# Patient Record
Sex: Female | Born: 2000 | Race: White | Hispanic: Yes | Smoking: Never smoker
Health system: Southern US, Community
[De-identification: ages and names within clinical notes are randomized; demographics above are authoritative.]

## PROBLEM LIST (undated history)

## (undated) DIAGNOSIS — J45909 Unspecified asthma, uncomplicated: Secondary | ICD-10-CM

## (undated) DIAGNOSIS — K219 Gastro-esophageal reflux disease without esophagitis: Secondary | ICD-10-CM

## (undated) HISTORY — PX: NO PAST SURGERIES: SHX2092

---
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
## 2022-01-17 DIAGNOSIS — Z0001 Encounter for general adult medical examination with abnormal findings: Secondary | ICD-10-CM | POA: Diagnosis not present

## 2022-01-17 DIAGNOSIS — N912 Amenorrhea, unspecified: Secondary | ICD-10-CM | POA: Diagnosis not present

## 2022-01-17 DIAGNOSIS — Z113 Encounter for screening for infections with a predominantly sexual mode of transmission: Secondary | ICD-10-CM | POA: Diagnosis not present

## 2022-01-17 DIAGNOSIS — Z30011 Encounter for initial prescription of contraceptive pills: Secondary | ICD-10-CM | POA: Diagnosis not present

## 2022-01-17 DIAGNOSIS — Z6829 Body mass index (BMI) 29.0-29.9, adult: Secondary | ICD-10-CM | POA: Diagnosis not present

## 2022-01-17 DIAGNOSIS — Z3040 Encounter for surveillance of contraceptives, unspecified: Secondary | ICD-10-CM | POA: Diagnosis not present

## 2022-01-17 DIAGNOSIS — Z01411 Encounter for gynecological examination (general) (routine) with abnormal findings: Secondary | ICD-10-CM | POA: Diagnosis not present

## 2022-01-31 IMAGING — CT CT ABD-PELV W/ CM
2 of 4 series · 16 of 46 positions shown, 18 images · IV contrast (omnipaque)
Comparison: None

CLINICAL DATA: Right lower quadrant abdominal pain and nausea.

EXAM:
CT ABDOMEN AND PELVIS WITH CONTRAST
TECHNIQUE: Multidetector CT imaging of the abdomen and pelvis was performed
using the standard protocol following bolus administration of
intravenous contrast.
CONTRAST:  100mL OMNIPAQUE IOHEXOL 300 MG/ML  SOLN

[Series 2: axial st · axial · 0.75mm/px · z∈[+958,+1354]mm · 13 of 89 slices shown, 15 images]
[im 5/89  soft-tissue]
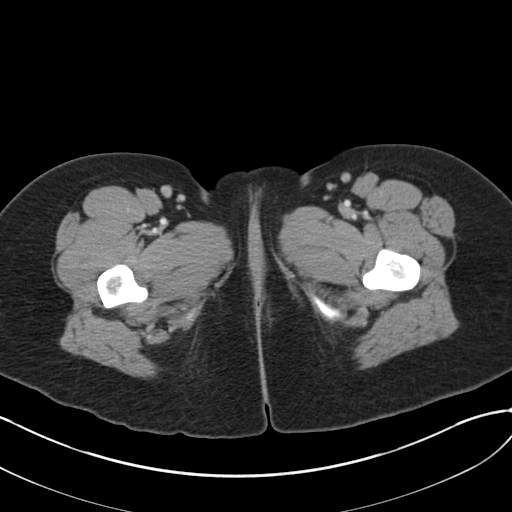
[im 5/89  bone]
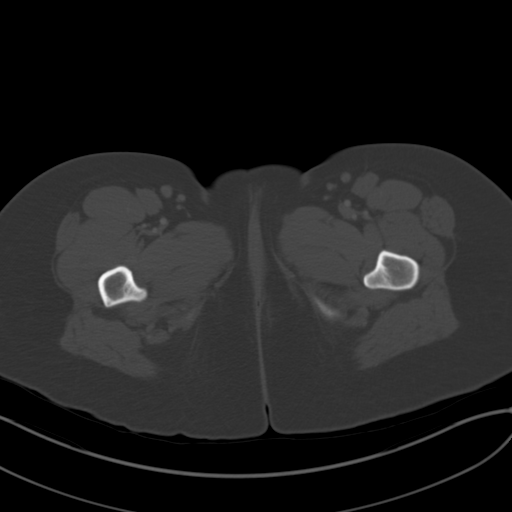
[im 14/89  soft-tissue]
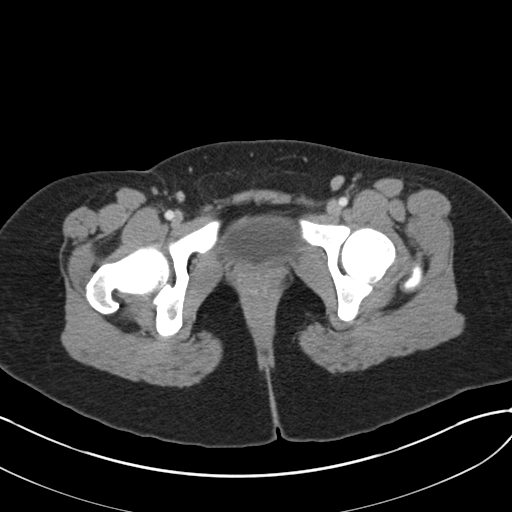
[im 19/89  soft-tissue]
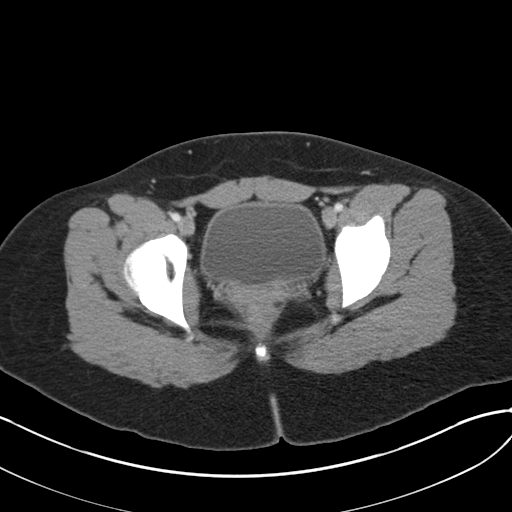
[im 24/89  soft-tissue]
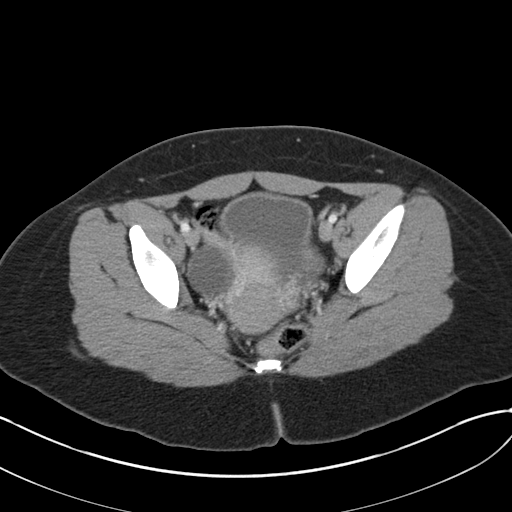
[im 33/89  soft-tissue]
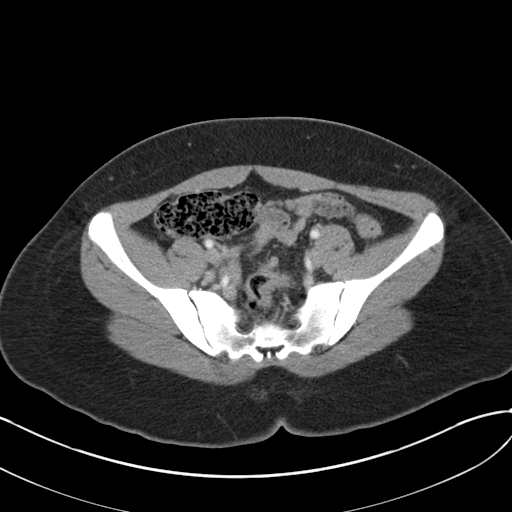
[im 38/89  soft-tissue]
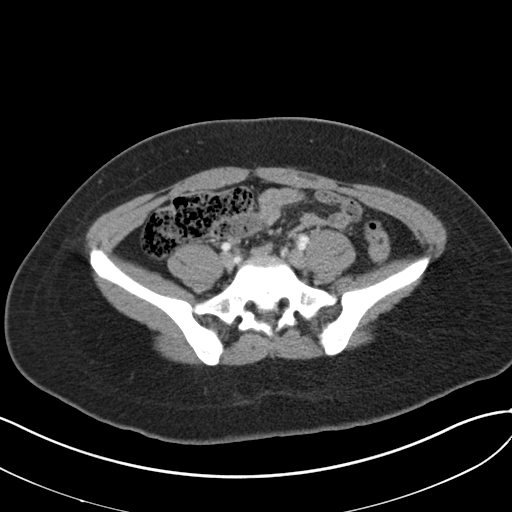
[im 47/89  soft-tissue]
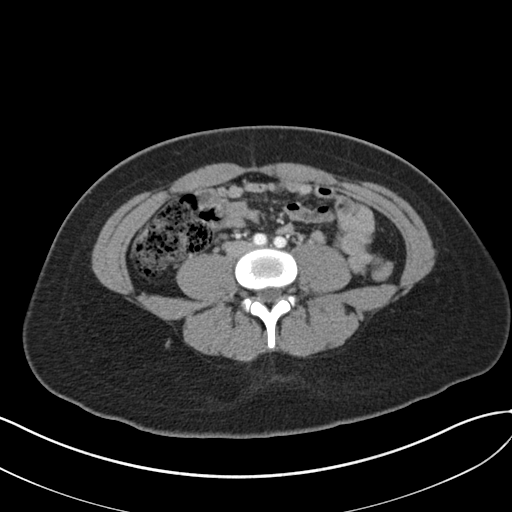
[im 51/89  soft-tissue]
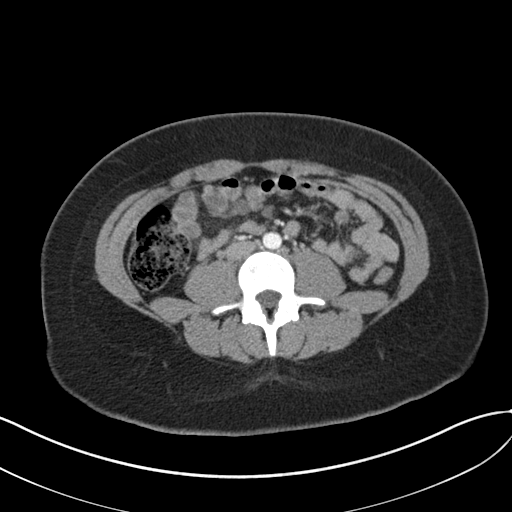
[im 56/89  soft-tissue]
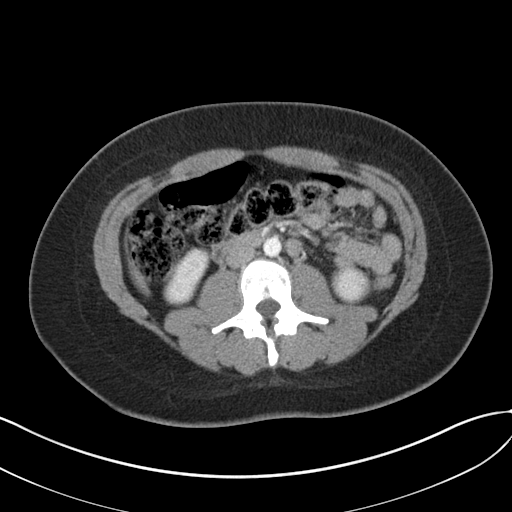
[im 56/89  bone]
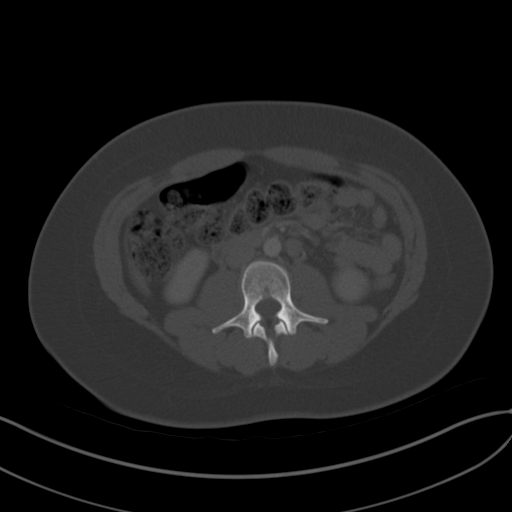
[im 65/89  soft-tissue]
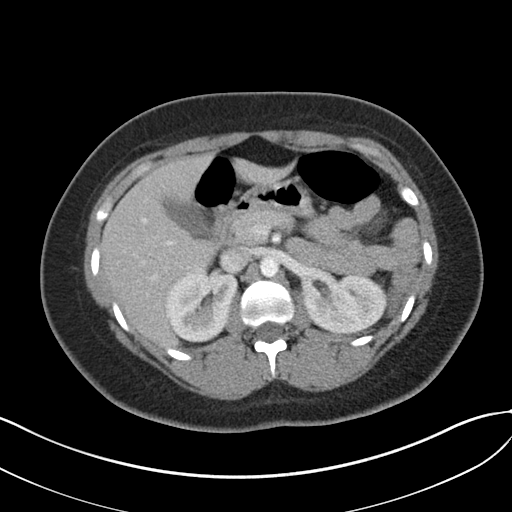
[im 70/89  soft-tissue]
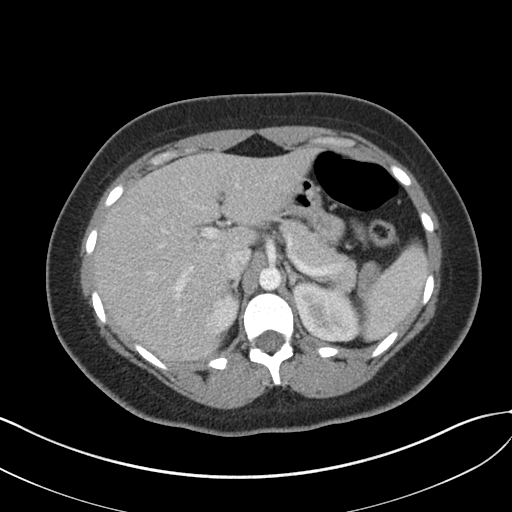
[im 75/89  soft-tissue]
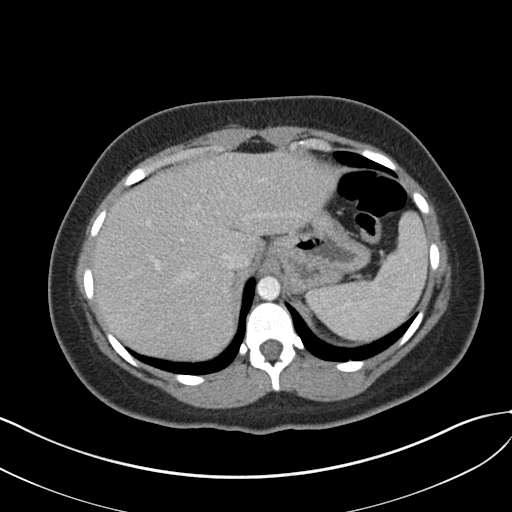
[im 84/89  soft-tissue]
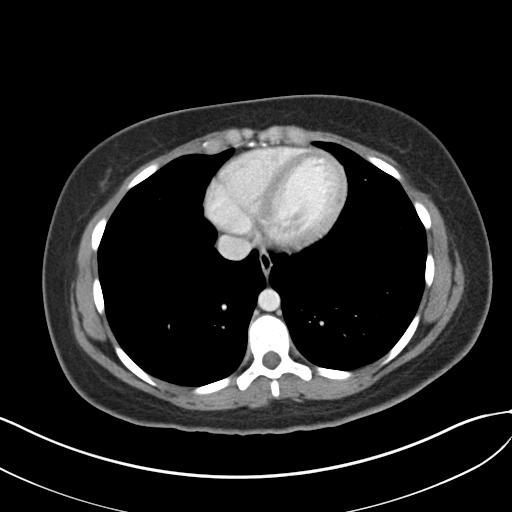

[Series 4: coronal st · coronal · 0.68mm/px · 3 of 137 slices shown]
[im 46/137  soft-tissue]
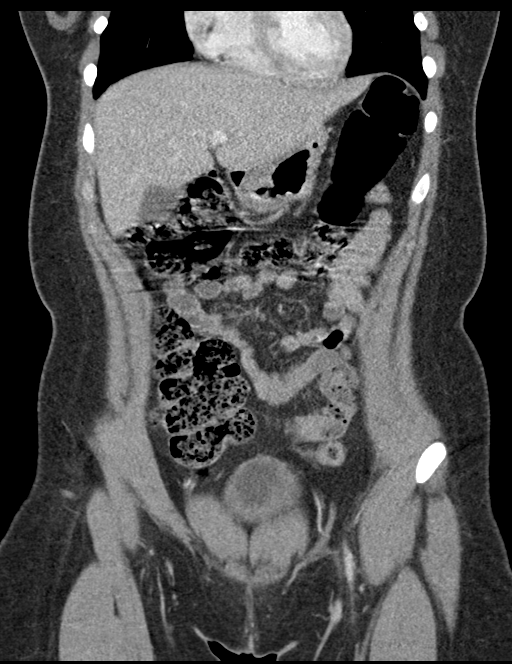
[im 61/137  soft-tissue]
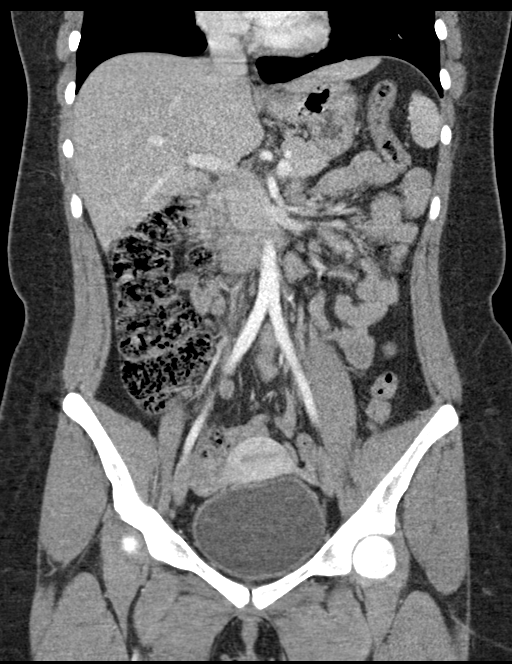
[im 76/137  soft-tissue]
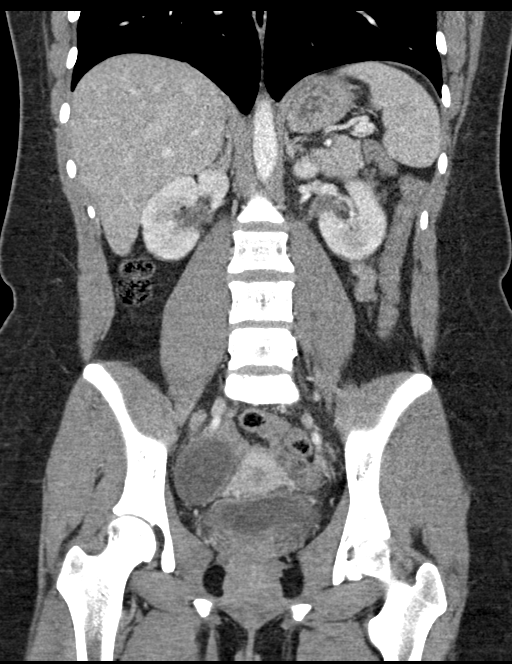

[16 of 46 positions shown; findings below may reference images not displayed]

FINDINGS: Lower chest: Insert lung bases

Hepatobiliary: No focal hepatic lesions or intrahepatic biliary
dilatation. The gallbladder is normal. No common bile duct
dilatation.

Pancreas: No mass, inflammation or ductal dilatation.

Spleen: Normal size.  No focal lesions.

Adrenals/Urinary Tract: The adrenal glands and kidneys are
unremarkable. No renal calculi or obstructing ureteral calculi. No
findings for pyelonephritis. The bladder is unremarkable.

Stomach/Bowel: The stomach, duodenum, small bowel and colon are
unremarkable. No acute inflammatory changes, mass lesions or
obstructive findings. The terminal ileum is normal. The appendix is
not identified for certain but I do not see any findings to suggest
acute appendicitis.

Vascular/Lymphatic: The aorta is normal in caliber. No dissection.
The branch vessels are patent. The major venous structures are
patent. No mesenteric or retroperitoneal mass or adenopathy. Small
scattered lymph nodes are noted.

Reproductive: The uterus is unremarkable. There is a simple
appearing 19 mm cyst associated with the left ovary and a simple 4
cm cyst associated with the right ovary.

Other: No pelvic mass or adenopathy. No free pelvic fluid
collections. No inguinal mass or adenopathy. No abdominal wall
hernia or subcutaneous lesions.

Musculoskeletal: No significant bony findings.
IMPRESSION: 1. No acute abdominal/pelvic findings, mass lesions or adenopathy.
2. Bilateral ovarian cysts.

## 2022-03-01 ENCOUNTER — Inpatient Hospital Stay (HOSPITAL_BASED_OUTPATIENT_CLINIC_OR_DEPARTMENT_OTHER): Payer: Medicaid Other

## 2022-03-01 ENCOUNTER — Encounter (HOSPITAL_COMMUNITY): Payer: Self-pay | Admitting: *Deleted

## 2022-03-01 ENCOUNTER — Inpatient Hospital Stay (HOSPITAL_COMMUNITY)
Admission: AD | Admit: 2022-03-01 | Discharge: 2022-03-01 | Disposition: A | Discharge status: Home / Self Care | Payer: Medicaid Other | Attending: Obstetrics and Gynecology | Admitting: Obstetrics and Gynecology

## 2022-03-01 DIAGNOSIS — Z3A24 24 weeks gestation of pregnancy: Secondary | ICD-10-CM | POA: Diagnosis not present

## 2022-03-01 DIAGNOSIS — O26899 Other specified pregnancy related conditions, unspecified trimester: Secondary | ICD-10-CM

## 2022-03-01 DIAGNOSIS — R102 Pelvic and perineal pain: Secondary | ICD-10-CM | POA: Diagnosis not present

## 2022-03-01 DIAGNOSIS — R1030 Lower abdominal pain, unspecified: Secondary | ICD-10-CM

## 2022-03-01 DIAGNOSIS — O26893 Other specified pregnancy related conditions, third trimester: Secondary | ICD-10-CM | POA: Diagnosis not present

## 2022-03-01 DIAGNOSIS — O26892 Other specified pregnancy related conditions, second trimester: Secondary | ICD-10-CM | POA: Diagnosis present

## 2022-03-01 DIAGNOSIS — O0932 Supervision of pregnancy with insufficient antenatal care, second trimester: Secondary | ICD-10-CM | POA: Diagnosis not present

## 2022-03-01 HISTORY — DX: Unspecified asthma, uncomplicated: J45.909

## 2022-03-01 LAB — HEMOGLOBIN A1C
Hgb A1c MFr Bld: 4.7 % — ABNORMAL LOW (ref 4.8–5.6)
Mean Plasma Glucose: 88.19 mg/dL

## 2022-03-01 LAB — DIFFERENTIAL
Abs Immature Granulocytes: 0.06 10*3/uL (ref 0.00–0.07)
Basophils Absolute: 0 10*3/uL (ref 0.0–0.1)
Basophils Relative: 0 %
Eosinophils Absolute: 0.1 10*3/uL (ref 0.0–0.5)
Eosinophils Relative: 1 %
Immature Granulocytes: 1 %
Lymphocytes Relative: 25 %
Lymphs Abs: 2.1 10*3/uL (ref 0.7–4.0)
Monocytes Absolute: 0.7 10*3/uL (ref 0.1–1.0)
Monocytes Relative: 8 %
Neutro Abs: 5.6 10*3/uL (ref 1.7–7.7)
Neutrophils Relative %: 65 %

## 2022-03-01 LAB — URINALYSIS, ROUTINE W REFLEX MICROSCOPIC
Bacteria, UA: NONE SEEN
Bilirubin Urine: NEGATIVE
Glucose, UA: NEGATIVE mg/dL
Hgb urine dipstick: NEGATIVE
Ketones, ur: NEGATIVE mg/dL
Nitrite: NEGATIVE
Protein, ur: NEGATIVE mg/dL
Specific Gravity, Urine: 1.014 (ref 1.005–1.030)
pH: 7 (ref 5.0–8.0)

## 2022-03-01 LAB — CBC
HCT: 32 % — ABNORMAL LOW (ref 36.0–46.0)
Hemoglobin: 11.2 g/dL — ABNORMAL LOW (ref 12.0–15.0)
MCH: 31.5 pg (ref 26.0–34.0)
MCHC: 35 g/dL (ref 30.0–36.0)
MCV: 90.1 fL (ref 80.0–100.0)
Platelets: 258 10*3/uL (ref 150–400)
RBC: 3.55 MIL/uL — ABNORMAL LOW (ref 3.87–5.11)
RDW: 13.2 % (ref 11.5–15.5)
WBC: 8.5 10*3/uL (ref 4.0–10.5)
nRBC: 0 % (ref 0.0–0.2)

## 2022-03-01 LAB — WET PREP, GENITAL
Clue Cells Wet Prep HPF POC: NONE SEEN
Sperm: NONE SEEN
Trich, Wet Prep: NONE SEEN
WBC, Wet Prep HPF POC: <10 (ref <10)
Yeast Wet Prep HPF POC: NONE SEEN

## 2022-03-01 LAB — TYPE AND SCREEN
ABO/RH(D): O POS
Antibody Screen: NEGATIVE

## 2022-03-01 LAB — HEPATITIS B SURFACE ANTIGEN: Hepatitis B Surface Ag: NONREACTIVE

## 2022-03-01 LAB — HIV ANTIBODY (ROUTINE TESTING W REFLEX): HIV Screen 4th Generation wRfx: NONREACTIVE

## 2022-03-01 NOTE — MAU Note (Signed)
Jo Sloan is a 21 y.o. at Unknown here in MAU reporting: having a little bit of abd pain, lower abd. Started yesterday. No bleeding. No PNC< had [place] at [facility] 5/18, was given due date.   Onset of complaint: yesterday Pain score: mild cramping- comes a couple times a day Vitals:   03/01/22 1755  BP: 116/70  Pulse: 74  Resp: 16  Temp: 98.9 F (37.2 C)  SpO2: 100%     FHT:136 Lab orders placed from triage:  urine

## 2022-03-01 NOTE — Discharge Instructions (Signed)

## 2022-03-01 NOTE — MAU Provider Note (Signed)
History     CSN: [id number]  Arrival date and time: 03/01/22 1542   Event Date/Time   First Provider Initiated Contact with Patient 03/01/22 1846      Chief Complaint  Patient presents with   Abdominal Pain   HPI  Jo SimonsJennifer Sloan is a 21 y.o. G1P0 at 2175w6d who presents for evaluation of lower abdominal pain. Patient reports sharp shooting abdominal pain that is intermittent and worse when she walks. She denies any pain at this time. She denies any vaginal bleeding, discharge, and leaking of fluid. Denies any constipation, diarrhea or any urinary complaints. Reports normal fetal movement.  She has not had any care in this pregnancy yet. She reports difficulty finding someone to take her because of how far along she is.    OB History     Gravida  1   Para      Term      Preterm      AB      Living         SAB      IAB      Ectopic      Multiple      Live Births              Past Medical History:  Diagnosis Date   Asthma    GERD (gastroesophageal reflux disease)     Past Surgical History:  Procedure Laterality Date   NO PAST SURGERIES      Family History  Problem Relation Age of Onset   Healthy Mother    Healthy Father     Social History   Tobacco Use   Smoking status: Never   Smokeless tobacco: Never  Vaping Use   Vaping Use: Never used  Substance Use Topics   Alcohol use: Never   Drug use: Not Currently    Frequency: 2.0 times per week    Types: Marijuana    Comment: stopped in March    Allergies: No Known Allergies  No medications prior to admission.    Review of Systems  Constitutional: Negative.  Negative for fatigue and fever.  HENT: Negative.    Respiratory: Negative.  Negative for shortness of breath.   Cardiovascular: Negative.  Negative for chest pain.  Gastrointestinal:  Positive for abdominal pain. Negative for constipation, diarrhea, nausea and vomiting.  Genitourinary: Negative.  Negative for dysuria.   Neurological: Negative.  Negative for dizziness and headaches.  Physical Exam   Blood pressure 116/70, pulse 74, temperature 98.9 F (37.2 C), temperature source Oral, resp. rate 16, height 5\' 1"  (1.549 m), weight 74 kg, last menstrual period 09/15/2021, SpO2 100 %.  Patient Vitals for the past 24 hrs:  BP Temp Temp src Pulse Resp SpO2 Height Weight  03/01/22 1755 116/70 98.9 F (37.2 C) Oral 74 16 100 % 5\' 1"  (1.549 m) 74 kg    Physical Exam Vitals and nursing note reviewed.  Constitutional:      General: She is not in acute distress.    Appearance: She is well-developed.  HENT:     Head: Normocephalic.  Eyes:     Pupils: Pupils are equal, round, and reactive to light.  Cardiovascular:     Rate and Rhythm: Normal rate and regular rhythm.     Heart sounds: Normal heart sounds.  Pulmonary:     Effort: Pulmonary effort is normal. No respiratory distress.     Breath sounds: Normal breath sounds.  Abdominal:     General: Bowel  sounds are normal. There is no distension.     Palpations: Abdomen is soft.     Tenderness: There is no abdominal tenderness.  Skin:    General: Skin is warm and dry.  Neurological:     Mental Status: She is alert and oriented to person, place, and time.  Psychiatric:        Mood and Affect: Mood normal.        Behavior: Behavior normal.        Thought Content: Thought content normal.        Judgment: Judgment normal.   Fetal Tracing:  Baseline: 140 Variability: moderate Accels: none Decels: none  Toco: none  Dilation: Closed Effacement (%): Thick Exam by:: Cleone Slim CNM   MAU Course  Procedures  Results for orders placed or performed during the hospital encounter of 03/01/22 (from the past 24 hour(s))  Urinalysis, Routine w reflex microscopic Urine, Clean Catch     Status: Abnormal   Collection Time: 03/01/22  6:01 PM  Result Value Ref Range   Color, Urine YELLOW YELLOW   APPearance HAZY (A) CLEAR   Specific Gravity, Urine  1.014 1.005 - 1.030   pH 7.0 5.0 - 8.0   Glucose, UA NEGATIVE NEGATIVE mg/dL   Hgb urine dipstick NEGATIVE NEGATIVE   Bilirubin Urine NEGATIVE NEGATIVE   Ketones, ur NEGATIVE NEGATIVE mg/dL   Protein, ur NEGATIVE NEGATIVE mg/dL   Nitrite NEGATIVE NEGATIVE   Leukocytes,Ua TRACE (A) NEGATIVE   RBC / HPF 0-5 0 - 5 RBC/hpf   WBC, UA 0-5 0 - 5 WBC/hpf   Bacteria, UA NONE SEEN NONE SEEN   Squamous Epithelial / LPF 6-10 0 - 5  Hepatitis B surface antigen     Status: None   Collection Time: 03/01/22  7:06 PM  Result Value Ref Range   Hepatitis B Surface Ag NON REACTIVE NON REACTIVE  CBC     Status: Abnormal   Collection Time: 03/01/22  7:06 PM  Result Value Ref Range   WBC 8.5 4.0 - 10.5 K/uL   RBC 3.55 (L) 3.87 - 5.11 MIL/uL   Hemoglobin 11.2 (L) 12.0 - 15.0 g/dL   HCT 43.1 (L) 54.0 - 08.6 %   MCV 90.1 80.0 - 100.0 fL   MCH 31.5 26.0 - 34.0 pg   MCHC 35.0 30.0 - 36.0 g/dL   RDW 76.1 95.0 - 93.2 %   Platelets 258 150 - 400 K/uL   nRBC 0.0 0.0 - 0.2 %  Differential     Status: None   Collection Time: 03/01/22  7:06 PM  Result Value Ref Range   Neutrophils Relative % 65 %   Neutro Abs 5.6 1.7 - 7.7 K/uL   Lymphocytes Relative 25 %   Lymphs Abs 2.1 0.7 - 4.0 K/uL   Monocytes Relative 8 %   Monocytes Absolute 0.7 0.1 - 1.0 K/uL   Eosinophils Relative 1 %   Eosinophils Absolute 0.1 0.0 - 0.5 K/uL   Basophils Relative 0 %   Basophils Absolute 0.0 0.0 - 0.1 K/uL   Immature Granulocytes 1 %   Abs Immature Granulocytes 0.06 0.00 - 0.07 K/uL  Type and screen [facility]     Status: None   Collection Time: 03/01/22  7:06 PM  Result Value Ref Range   ABO/RH(D) O POS    Antibody Screen NEG    Sample Expiration      03/04/2022,2359 Performed at [facility], [address],  [place], [place] [address]   Hemoglobin A1c     Status: Abnormal   Collection Time: 03/01/22  7:06 PM  Result Value Ref Range   Hgb A1c MFr Bld 4.7 (L) 4.8 - 5.6 %   Mean Plasma Glucose  88.19 mg/dL  Wet prep, genital     Status: None   Collection Time: 03/01/22  8:00 PM  Result Value Ref Range   Yeast Wet Prep HPF POC NONE SEEN NONE SEEN   Trich, Wet Prep NONE SEEN NONE SEEN   Clue Cells Wet Prep HPF POC NONE SEEN NONE SEEN   WBC, Wet Prep HPF POC <10 <10   Sperm NONE SEEN     MDM Labs ordered and reviewed.   UA, UC OB Panel Wet prep and gc/chlamydia [place] MFM OB Limited- anterior placenta, normal AFI  CNM independently reviewed the imaging ordered. Imaging show breech fetus with anterior placenta  Urgent message sent to [facility] to schedule new OB and order placed for outpatient anatomy ultrasound  Assessment and Plan   1. Pain of round ligament during pregnancy   2. [redacted] weeks gestation of pregnancy   3. No prenatal care in current pregnancy in second trimester     -Discharge home in stable condition -Second trimester precautions discussed -Patient advised to follow-up with [facility] to establish care -Patient may return to MAU as needed or if her condition were to change or worsen  Rolm Bookbinder, CNM 03/01/2022, 8:53 PM

## 2022-03-02 LAB — GC/CHLAMYDIA PROBE AMP (~~LOC~~) NOT AT ARMC
Chlamydia: NEGATIVE
Comment: NEGATIVE
Comment: NORMAL
Neisseria Gonorrhea: NEGATIVE

## 2022-03-02 LAB — RPR: RPR Ser Ql: NONREACTIVE

## 2022-03-03 LAB — CULTURE, OB URINE

## 2022-03-03 LAB — RUBELLA SCREEN: Rubella: 1.8 index (ref >0.99)

## 2022-03-13 ENCOUNTER — Other Ambulatory Visit (HOSPITAL_COMMUNITY): Payer: Self-pay

## 2022-03-13 ENCOUNTER — Ambulatory Visit: Payer: Medicaid Other

## 2022-03-13 ENCOUNTER — Other Ambulatory Visit: Payer: Self-pay | Admitting: *Deleted

## 2022-03-13 DIAGNOSIS — O43192 Other malformation of placenta, second trimester: Secondary | ICD-10-CM | POA: Insufficient documentation

## 2022-03-13 DIAGNOSIS — Z3402 Encounter for supervision of normal first pregnancy, second trimester: Secondary | ICD-10-CM | POA: Insufficient documentation

## 2022-03-13 DIAGNOSIS — O0932 Supervision of pregnancy with insufficient antenatal care, second trimester: Secondary | ICD-10-CM

## 2022-03-13 DIAGNOSIS — Z363 Encounter for antenatal screening for malformations: Secondary | ICD-10-CM | POA: Diagnosis not present

## 2022-03-13 DIAGNOSIS — R102 Pelvic and perineal pain: Secondary | ICD-10-CM | POA: Diagnosis not present

## 2022-03-13 DIAGNOSIS — Z3A24 24 weeks gestation of pregnancy: Secondary | ICD-10-CM

## 2022-03-13 DIAGNOSIS — O26899 Other specified pregnancy related conditions, unspecified trimester: Secondary | ICD-10-CM | POA: Diagnosis not present

## 2022-03-13 DIAGNOSIS — Z3A25 25 weeks gestation of pregnancy: Secondary | ICD-10-CM | POA: Insufficient documentation

## 2022-03-13 DIAGNOSIS — O365920 Maternal care for other known or suspected poor fetal growth, second trimester, not applicable or unspecified: Secondary | ICD-10-CM | POA: Insufficient documentation

## 2022-03-13 DIAGNOSIS — O365921 Maternal care for other known or suspected poor fetal growth, second trimester, fetus 1: Secondary | ICD-10-CM

## 2022-03-15 ENCOUNTER — Encounter: Payer: Self-pay | Admitting: General Practice

## 2022-03-15 ENCOUNTER — Encounter: Payer: Medicaid Other | Admitting: Family Medicine

## 2022-03-17 ENCOUNTER — Encounter: Payer: Self-pay | Admitting: Obstetrics and Gynecology

## 2022-03-17 ENCOUNTER — Encounter: Payer: Self-pay | Admitting: General Practice

## 2022-03-17 ENCOUNTER — Ambulatory Visit (INDEPENDENT_AMBULATORY_CARE_PROVIDER_SITE_OTHER): Payer: Medicaid Other | Admitting: Obstetrics and Gynecology

## 2022-03-17 DIAGNOSIS — Z3400 Encounter for supervision of normal first pregnancy, unspecified trimester: Secondary | ICD-10-CM | POA: Diagnosis not present

## 2022-03-17 DIAGNOSIS — Z23 Encounter for immunization: Secondary | ICD-10-CM

## 2022-03-17 DIAGNOSIS — O43199 Other malformation of placenta, unspecified trimester: Secondary | ICD-10-CM

## 2022-03-17 DIAGNOSIS — O0933 Supervision of pregnancy with insufficient antenatal care, third trimester: Secondary | ICD-10-CM

## 2022-03-17 HISTORY — DX: Supervision of pregnancy with insufficient antenatal care, third trimester: O09.33

## 2022-03-17 HISTORY — DX: Other malformation of placenta, unspecified trimester: O43.199

## 2022-03-17 HISTORY — DX: Encounter for supervision of normal first pregnancy, unspecified trimester: Z34.00

## 2022-03-17 NOTE — Patient Instructions (Addendum)
Considering Waterbirth? Guide for patients at [facility]) Why consider waterbirth? Gentle birth for babies  Less pain medicine used in labor  May allow for passive descent/less pushing  May reduce perineal tears  More mobility and instinctive maternal position changes  Increased maternal relaxation   Is waterbirth safe? What are the risks of infection, drowning or other complications? Infection:  Very low risk (3.7 % for tub vs 4.8% for bed)  7 in 8000 waterbirths with documented infection  Poorly cleaned equipment most common cause  Slightly lower group B strep transmission rate  Drowning  Maternal:  Very low risk  Related to seizures or fainting  Newborn:  Very low risk. No evidence of increased risk of respiratory problems in multiple large studies  Physiological protection from breathing under water  Avoid underwater birth if there are any fetal complications  Once baby's head is out of the water, keep it out.  Birth complication  Some reports of cord trauma, but risk decreased by bringing baby to surface gradually  No evidence of increased risk of shoulder dystocia. Mothers can usually change positions faster in water than in a bed, possibly aiding the maneuvers to free the shoulder.   There are 2 things you MUST do to have a waterbirth with [facility]: Attend a waterbirth class at [employer] [facility] at [facility]   3rd Wednesday of every month from 7-9 pm (virtual during COVID) [employer] at [email or web address] or [email or web address] or by calling [phone] Bring [place] the certificate from the class to your prenatal appointment or send via MyChart Meet with a midwife at 36 weeks* to see if you can still plan a waterbirth and to sign the consent.   *We also recommend that you schedule as many of your prenatal visits with a midwife as possible.    Helpful information: You may want to bring a bathing suit top to the hospital  to wear during labor but this is optional.  All other supplies are provided by the hospital. Please arrive at the hospital with signs of active labor, and do not wait at home until late in labor. It takes 45 min- 1 hour for fetal monitoring, and check in to your room to take place, plus transport and filling of the waterbirth tub.    Things that would prevent you from having a waterbirth: Premature, <37wks  Previous cesarean birth  Presence of thick meconium-stained fluid  Multiple gestation (Twins, triplets, etc.)  Uncontrolled diabetes or gestational diabetes requiring medication  Hypertension diagnosed in pregnancy or preexisting hypertension (gestational hypertension, preeclampsia, or chronic hypertension) Fetal growth restriction (your baby measures less than 10th percentile on ultrasound) Heavy vaginal bleeding  Non-reassuring fetal heart rate  Active infection (MRSA, etc.). Group B Strep is NOT a contraindication for waterbirth.  If your labor has to be induced and induction method requires continuous monitoring of the baby's heart rate  Other risks/issues identified by your obstetrical provider   Please remember that birth is unpredictable. Under certain unforeseeable circumstances your provider may advise against giving birth in the tub. These decisions will be made on a case-by-case basis and with the safety of you and your baby as our highest priority.    Updated 01/11/22  Third Trimester of Pregnancy  The third trimester of pregnancy is from week 28 through week 40. This is months 7 through 9. The third trimester is a time when the unborn baby (fetus) is growing rapidly. At the end of the  ninth month, the fetus is about 20 inches long and weighs 6-10 pounds. Body changes during your third trimester During the third trimester, your body will continue to go through many changes. The changes vary and generally return to normal after your baby is born. Physical changes Your weight  will continue to increase. You can expect to gain 25-35 pounds (11-16 kg) by the end of the pregnancy if you begin pregnancy at a normal weight. If you are underweight, you can expect to gain 28-40 lb (about 13-18 kg), and if you are overweight, you can expect to gain 15-25 lb (about 7-11 kg). You may begin to get stretch marks on your hips, abdomen, and breasts. Your breasts will continue to grow and may hurt. A yellow fluid (colostrum) may leak from your breasts. This is the first milk you are producing for your baby. You may have changes in your hair. These can include thickening of your hair, rapid growth, and changes in texture. Some people also have hair loss during or after pregnancy, or hair that feels dry or thin. Your belly button may stick out. You may notice more swelling in your hands, face, or ankles. Health changes You may have heartburn. You may have constipation. You may develop hemorrhoids. You may develop swollen, bulging veins (varicose veins) in your legs. You may have increased body aches in the pelvis, back, or thighs. This is due to weight gain and increased hormones that are relaxing your joints. You may have increased tingling or numbness in your hands, arms, and legs. The skin on your abdomen may also feel numb. You may feel short of breath because of your expanding uterus. Other changes You may urinate more often because the fetus is moving lower into your pelvis and pressing on your bladder. You may have more problems sleeping. This may be caused by the size of your abdomen, an increased need to urinate, and an increase in your body's metabolism. You may notice the fetus "dropping," or moving lower in your abdomen (lightening). You may have increased vaginal discharge. You may notice that you have pain around your pelvic bone as your uterus distends. Follow these instructions at home: Medicines Follow your health care provider's instructions regarding medicine use.  Specific medicines may be either safe or unsafe to take during pregnancy. Do not take any medicines unless approved by your health care provider. Take a prenatal vitamin that contains at least 600 micrograms (mcg) of folic acid. Eating and drinking Eat a healthy diet that includes fresh fruits and vegetables, whole grains, good sources of protein such as meat, eggs, or tofu, and low-fat dairy products. Avoid raw meat and unpasteurized juice, milk, and cheese. These carry germs that can harm you and your baby. Eat 4 or 5 small meals rather than 3 large meals a day. You may need to take these actions to prevent or treat constipation: Drink enough fluid to keep your urine pale yellow. Eat foods that are high in fiber, such as beans, whole grains, and fresh fruits and vegetables. Limit foods that are high in fat and processed sugars, such as fried or sweet foods. Activity Exercise only as directed by your health care provider. Most people can continue their usual exercise routine during pregnancy. Try to exercise for 30 minutes at least 5 days a week. Stop exercising if you experience contractions in the uterus. Stop exercising if you develop pain or cramping in the lower abdomen or lower back. Avoid heavy lifting. Do not exercise  if it is very hot or humid or if you are at a high altitude. If you choose to, you may continue to have sex unless your health care provider tells you not to. Relieving pain and discomfort Take frequent breaks and rest with your legs raised (elevated) if you have leg cramps or low back pain. Take warm sitz baths to soothe any pain or discomfort caused by hemorrhoids. Use hemorrhoid cream if your health care provider approves. Wear a supportive bra to prevent discomfort from breast tenderness. If you develop varicose veins: Wear support hose as told by your health care provider. Elevate your feet for 15 minutes, 3-4 times a day. Limit salt in your diet. Safety Talk to  your health care provider before traveling far distances. Do not use hot tubs, steam rooms, or saunas. Wear your seat belt at all times when driving or riding in a car. Talk with your health care provider if someone is verbally or physically abusive to you. Preparing for birth To prepare for the arrival of your baby: Take prenatal classes to understand, practice, and ask questions about labor and delivery. Visit the hospital and tour the maternity area. Purchase a rear-facing car seat and make sure you know how to install it in your car. Prepare the baby's room or sleeping area. Make sure to remove all pillows and stuffed animals from the baby's crib to prevent suffocation. General instructions Avoid cat litter boxes and soil used by cats. These carry germs that can cause birth defects in the baby. If you have a cat, ask someone to clean the litter box for you. Do not douche or use tampons. Do not use scented sanitary pads. Do not use any products that contain nicotine or tobacco, such as cigarettes, e-cigarettes, and chewing tobacco. If you need help quitting, ask your health care provider. Do not use any herbal remedies, illegal drugs, or medicines that were not prescribed to you. Chemicals in these products can harm your baby. Do not drink alcohol. You will have more frequent prenatal exams during the third trimester. During a routine prenatal visit, your health care provider will do a physical exam, perform tests, and discuss your overall health. Keep all follow-up visits. This is important. Where to find more information American Pregnancy Association: americanpregnancy.org [employer]nec[employer]: https://www.todd-brady.net/acog.org/en/Womens%20Health/Pregnancy Office on [employer]: MightyReward.co.nzwomenshealth.gov/pregnancy Contact a health care provider if you have: A fever. Mild pelvic cramps, pelvic pressure, or nagging pain in your abdominal area or lower back. Vomiting or  diarrhea. Bad-smelling vaginal discharge or foul-smelling urine. Pain when you urinate. A headache that does not go away when you take medicine. Visual changes or see spots in front of your eyes. Get help right away if: Your water breaks. You have regular contractions less than 5 minutes apart. You have spotting or bleeding from your vagina. You have severe abdominal pain. You have difficulty breathing. You have chest pain. You have fainting spells. You have not felt your baby move for the time period told by your health care provider. You have new or increased pain, swelling, or redness in an arm or leg. Summary The third trimester of pregnancy is from week 28 through week 40 (months 7 through 9). You may have more problems sleeping. This can be caused by the size of your abdomen, an increased need to urinate, and an increase in your body's metabolism. You will have more frequent prenatal exams during the third trimester. Keep all follow-up visits. This is important.  This information is not intended to replace advice given to you by your health care provider. Make sure you discuss any questions you have with your health care provider. Document Revised: 03/03/2020 Document Reviewed: 01/08/2020 Elsevier Patient Education  2023 [employer].  Contraception Choices Contraception, also called birth control, refers to methods or devices that prevent pregnancy. Hormonal methods  Contraceptive implant A contraceptive implant is a thin, plastic tube that contains a hormone that prevents pregnancy. It is different from an intrauterine device (IUD). It is inserted into the upper part of the arm by a health care provider. Implants can be effective for up to 3 years. Progestin-only injections Progestin-only injections are injections of progestin, a synthetic form of the hormone progesterone. They are given every 3 months by a health care provider. Birth control pills Birth control pills are  pills that contain hormones that prevent pregnancy. They must be taken once a day, preferably at the same time each day. A prescription is needed to use this method of contraception. Birth control patch The birth control patch contains hormones that prevent pregnancy. It is placed on the skin and must be changed once a week for three weeks and removed on the fourth week. A prescription is needed to use this method of contraception. Vaginal ring A vaginal ring contains hormones that prevent pregnancy. It is placed in the vagina for three weeks and removed on the fourth week. After that, the process is repeated with a new ring. A prescription is needed to use this method of contraception. Emergency contraceptive Emergency contraceptives prevent pregnancy after unprotected sex. They come in pill form and can be taken up to 5 days after sex. They work best the sooner they are taken after having sex. Most emergency contraceptives are available without a prescription. This method should not be used as your only form of birth control. Barrier methods  Female condom A female condom is a thin sheath that is worn over the penis during sex. Condoms keep sperm from going inside a woman's body. They can be used with a sperm-killing substance (spermicide) to increase their effectiveness. They should be thrown away after one use. Female condom A female condom is a soft, loose-fitting sheath that is put into the vagina before sex. The condom keeps sperm from going inside a woman's body. They should be thrown away after one use. Diaphragm A diaphragm is a soft, dome-shaped barrier. It is inserted into the vagina before sex, along with a spermicide. The diaphragm blocks sperm from entering the uterus, and the spermicide kills sperm. A diaphragm should be left in the vagina for 6-8 hours after sex and removed within 24 hours. A diaphragm is prescribed and fitted by a health care provider. A diaphragm should be replaced  every 1-2 years, after giving birth, after gaining more than 15 lb (6.8 kg), and after pelvic surgery. Cervical cap A cervical cap is a round, soft latex or plastic cup that fits over the cervix. It is inserted into the vagina before sex, along with spermicide. It blocks sperm from entering the uterus. The cap should be left in place for 6-8 hours after sex and removed within 48 hours. A cervical cap must be prescribed and fitted by a health care provider. It should be replaced every 2 years. Sponge A sponge is a soft, circular piece of polyurethane foam with spermicide in it. The sponge helps block sperm from entering the uterus, and the spermicide kills sperm. To use it, you make it  wet and then insert it into the vagina. It should be inserted before sex, left in for at least 6 hours after sex, and removed and thrown away within 30 hours. Spermicides Spermicides are chemicals that kill or block sperm from entering the cervix and uterus. They can come as a cream, jelly, suppository, foam, or tablet. A spermicide should be inserted into the vagina with an applicator at least 10-15 minutes before sex to allow time for it to work. The process must be repeated every time you have sex. Spermicides do not require a prescription. Intrauterine contraception Intrauterine device (IUD) An IUD is a T-shaped device that is put in a woman's uterus. There are two types: Hormone IUD.This type contains progestin, a synthetic form of the hormone progesterone. This type can stay in place for 3-5 years. Copper IUD.This type is wrapped in copper wire. It can stay in place for 10 years. Permanent methods of contraception Female tubal ligation In this method, a woman's fallopian tubes are sealed, tied, or blocked during surgery to prevent eggs from traveling to the uterus. Hysteroscopic sterilization In this method, a small, flexible insert is placed into each fallopian tube. The inserts cause scar tissue to form in the  fallopian tubes and block them, so sperm cannot reach an egg. The procedure takes about 3 months to be effective. Another form of birth control must be used during those 3 months. Female sterilization This is a procedure to tie off the tubes that carry sperm (vasectomy). After the procedure, the man can still ejaculate fluid (semen). Another form of birth control must be used for 3 months after the procedure. Natural planning methods Natural family planning In this method, a couple does not have sex on days when the woman could become pregnant. Calendar method In this method, the woman keeps track of the length of each menstrual cycle, identifies the days when pregnancy can happen, and does not have sex on those days. Ovulation method In this method, a couple avoids sex during ovulation. Symptothermal method This method involves not having sex during ovulation. The woman typically checks for ovulation by watching changes in her temperature and in the consistency of cervical mucus. Post-ovulation method In this method, a couple waits to have sex until after ovulation. Where to find more information Centers for Disease Control and Prevention: FootballExhibition.com.br Summary Contraception, also called birth control, refers to methods or devices that prevent pregnancy. Hormonal methods of contraception include implants, injections, pills, patches, vaginal rings, and emergency contraceptives. Barrier methods of contraception can include female condoms, female condoms, diaphragms, cervical caps, sponges, and spermicides. There are two types of IUDs (intrauterine devices). An IUD can be put in a woman's uterus to prevent pregnancy for 3-5 years. Permanent sterilization can be done through a procedure for males and females. Natural family planning methods involve nothaving sex on days when the woman could become pregnant. This information is not intended to replace advice given to you by your health care provider.  Make sure you discuss any questions you have with your health care provider. Document Revised: 03/01/2020 Document Reviewed: 03/01/2020 Elsevier Patient Education  2023 [employer].

## 2022-03-17 NOTE — Progress Notes (Signed)
  Subjective:    Jo Sloan is a G1P0 [redacted]w[redacted]d being seen today for her first obstetrical visit.  Her obstetrical history is significant for late onset to care at 27 weeks and first pregnancy. Patient does intend to breast feed. Pregnancy history fully reviewed.  Patient reports no complaints.  Vitals:   03/17/22 0813  BP: 130/73  Pulse: 68  Weight: 167 lb (75.8 kg)    HISTORY: OB History  Gravida Para Term Preterm AB Living  1            SAB IAB Ectopic Multiple Live Births               # Outcome Date GA Lbr Len/2nd Weight Sex Delivery Anes PTL Lv  1 Current            Past Medical History:  Diagnosis Date   Asthma    GERD (gastroesophageal reflux disease)    Past Surgical History:  Procedure Laterality Date   NO PAST SURGERIES     Family History  Problem Relation Age of Onset   Healthy Mother    Healthy Father      Exam    Uterus:  Fundal Height: 27 cm      Assessment:    Pregnancy: G1P0 Patient Active Problem List   Diagnosis Date Noted   Supervision of normal first pregnancy 03/17/2022   Marginal insertion of umbilical cord affecting management of mother 03/17/2022   Limited prenatal care in third trimester 03/17/2022        Plan:     Initial labs drawn. Prenatal vitamins. Problem list reviewed and updated. Genetic Screening discussed : [id number] ordered on 6/5 with MFM.  Ultrasound discussed; fetal survey: results reviewed. Follow up growth scheduled for dating and marginal cord insertion Third trimester labs today Discussed contraception options and patient is contemplating pills vs IUD Patient provided with pediatrician list Patient is also considering waterbirth- information provided and will plan for midwife visit  Follow up in 2 weeks. 50% of 30 min visit spent on counseling and coordination of care.     Jo Sloan 03/17/2022

## 2022-03-18 LAB — MATERNIT21 PLUS CORE+SCA
Fetal Fraction: 17
Monosomy X (Turner Syndrome): NOT DETECTED
Result (T21): NEGATIVE
Trisomy 13 (Patau syndrome): NEGATIVE
Trisomy 18 (Edwards syndrome): NEGATIVE
Trisomy 21 (Down syndrome): NEGATIVE
XXX (Triple X Syndrome): NOT DETECTED
XXY (Klinefelter Syndrome): NOT DETECTED
XYY (Jacobs Syndrome): NOT DETECTED

## 2022-03-18 LAB — CBC/D/PLT+RPR+RH+ABO+RUBIGG...
Antibody Screen: NEGATIVE
Basophils Absolute: 0 10*3/uL (ref 0.0–0.2)
Basos: 0 %
EOS (ABSOLUTE): 0.1 10*3/uL (ref 0.0–0.4)
Eos: 1 %
HCV Ab: NONREACTIVE
HIV Screen 4th Generation wRfx: NONREACTIVE
Hematocrit: 32.2 % — ABNORMAL LOW (ref 34.0–46.6)
Hemoglobin: 10.7 g/dL — ABNORMAL LOW (ref 11.1–15.9)
Hepatitis B Surface Ag: NEGATIVE
Immature Grans (Abs): 0.1 10*3/uL (ref 0.0–0.1)
Immature Granulocytes: 1 %
Lymphocytes Absolute: 1.7 10*3/uL (ref 0.7–3.1)
Lymphs: 24 %
MCH: 30.7 pg (ref 26.6–33.0)
MCHC: 33.2 g/dL (ref 31.5–35.7)
MCV: 92 fL (ref 79–97)
Monocytes Absolute: 0.6 10*3/uL (ref 0.1–0.9)
Monocytes: 8 %
Neutrophils Absolute: 4.6 10*3/uL (ref 1.4–7.0)
Neutrophils: 66 %
Platelets: 263 10*3/uL (ref 150–450)
RBC: 3.49 x10E6/uL — ABNORMAL LOW (ref 3.77–5.28)
RDW: 12.9 % (ref 11.7–15.4)
RPR Ser Ql: NONREACTIVE
Rh Factor: POSITIVE
Rubella Antibodies, IGG: 1.87 index (ref >0.99)
WBC: 6.9 10*3/uL (ref 3.4–10.8)

## 2022-03-18 LAB — INFECT DISEASE AB IGM REFLEX 1

## 2022-03-18 LAB — GLUCOSE TOLERANCE, 2 HOURS W/ 1HR
Glucose, 1 hour: 67 mg/dL — ABNORMAL LOW (ref 70–179)
Glucose, 2 hour: 93 mg/dL (ref 70–152)
Glucose, Fasting: 70 mg/dL (ref 70–91)

## 2022-03-18 LAB — TOXOPLASMA GONDII ANTIBODY, IGM: Toxoplasma Antibody- IgM: <3 AU/mL (ref 0.0–7.9)

## 2022-03-18 LAB — TOXOPLASMA GONDII ANTIBODY, IGG: Toxoplasma IgG Ratio: <3 IU/mL (ref 0.0–7.1)

## 2022-03-18 LAB — HCV INTERPRETATION

## 2022-03-20 ENCOUNTER — Telehealth: Payer: Self-pay | Admitting: Genetics

## 2022-03-20 NOTE — Telephone Encounter (Signed)
Jo Sloan was contacted by telephone to review their noninvasive prenatal screening (NIPS) result. The result is low risk. This screening significantly reduces the risk that the current pregnancy has Down syndrome, Trisomy 63, Trisomy 75, and common sex chromosome aneuploidies. Jo Sloan understands that this is a screening and not a diagnostic test.   We also discussed that Jo Sloan's toxoplasmosis screen does not indicate an active toxoplasmosis infection. All questions answered.

## 2022-03-29 ENCOUNTER — Ambulatory Visit (INDEPENDENT_AMBULATORY_CARE_PROVIDER_SITE_OTHER): Payer: Medicaid Other | Admitting: Family Medicine

## 2022-03-29 VITALS — BP 128/73 | HR 73 | Wt 170.0 lb

## 2022-03-29 DIAGNOSIS — O43199 Other malformation of placenta, unspecified trimester: Secondary | ICD-10-CM

## 2022-03-29 DIAGNOSIS — O0933 Supervision of pregnancy with insufficient antenatal care, third trimester: Secondary | ICD-10-CM

## 2022-03-29 DIAGNOSIS — Z3A28 28 weeks gestation of pregnancy: Secondary | ICD-10-CM

## 2022-03-29 DIAGNOSIS — Z3400 Encounter for supervision of normal first pregnancy, unspecified trimester: Secondary | ICD-10-CM

## 2022-03-29 DIAGNOSIS — O99013 Anemia complicating pregnancy, third trimester: Secondary | ICD-10-CM

## 2022-03-29 NOTE — Progress Notes (Signed)
   PRENATAL VISIT NOTE  Subjective:  Jo Sloan is a 21 y.o. G1P0 at [redacted]w[redacted]d being seen today for ongoing prenatal care.  She is currently monitored for the following issues for this high-risk pregnancy and has Supervision of normal first pregnancy; Marginal insertion of umbilical cord affecting management of mother; and Limited prenatal care in third trimester on their problem list.  Patient reports  right-sided abdominal pain that is worse with her lying on that side. .  Contractions: Not present. Vag. Bleeding: None.  Movement: Present. Denies leaking of fluid.   The following portions of the patient's history were reviewed and updated as appropriate: allergies, current medications, past family history, past medical history, past social history, past surgical history and problem list.   Objective:   Vitals:   03/29/22 1003  BP: 128/73  Pulse: 73  Weight: 170 lb (77.1 kg)    Fetal Status: Fetal Heart Rate (bpm): 134   Movement: Present     General:  Alert, oriented and cooperative. Patient is in no acute distress.  Skin: Skin is warm and dry. No rash noted.   Cardiovascular: Normal heart rate noted  Respiratory: Normal respiratory effort, no problems with respiration noted  Abdomen: Soft, gravid, appropriate for gestational age.  Pain/Pressure: Absent     Pelvic: Cervical exam deferred        Extremities: Normal range of motion.  Edema: Trace  Mental Status: Normal mood and affect. Normal behavior. Normal judgment and thought content.   Assessment and Plan:  Pregnancy: G1P0 at [redacted]w[redacted]d 1. [redacted] weeks gestation of pregnancy  2. Supervision of normal first pregnancy, antepartum FHT and FH normal  3. Marginal insertion of umbilical cord affecting management of mother Growth is normal. This appears to risk of fetal growth restricted babies.  Dopplers were normal last ultrasound. Past follow-up ultrasound on 6/28  4. Limited prenatal care in third trimester  5. Anemia during  pregnancy in third trimester Oral iron supplementation   Preterm labor symptoms and general obstetric precautions including but not limited to vaginal bleeding, contractions, leaking of fluid and fetal movement were reviewed in detail with the patient. Please refer to After Visit Summary for other counseling recommendations.   No follow-ups on file.  Future Appointments  Date Time Provider Department Center  04/05/2022  8:30 AM [facility] NURSE [facility]  04/05/2022  8:45 AM [facility] US5 [facility]  04/12/2022  9:35 AM Levie Heritage, DO [facility] None  04/24/2022 10:35 AM Warden Fillers, MD [facility] None  05/10/2022 10:35 AM Levie Heritage, DO [facility] None  05/17/2022 11:15 AM Levie Heritage, DO [facility] None  05/24/2022 10:35 AM Levie Heritage, DO [facility] None  05/31/2022 11:15 AM Levie Heritage, DO [facility] None  06/07/2022  9:55 AM Levie Heritage, DO [facility] None    Levie Heritage, DO

## 2022-04-05 ENCOUNTER — Other Ambulatory Visit: Payer: Self-pay | Admitting: *Deleted

## 2022-04-05 ENCOUNTER — Ambulatory Visit: Payer: Medicaid Other | Attending: Obstetrics

## 2022-04-05 ENCOUNTER — Ambulatory Visit: Payer: Medicaid Other | Admitting: *Deleted

## 2022-04-05 VITALS — BP 128/63 | HR 74

## 2022-04-05 DIAGNOSIS — O0932 Supervision of pregnancy with insufficient antenatal care, second trimester: Secondary | ICD-10-CM | POA: Diagnosis present

## 2022-04-05 DIAGNOSIS — O0933 Supervision of pregnancy with insufficient antenatal care, third trimester: Secondary | ICD-10-CM

## 2022-04-05 DIAGNOSIS — O43199 Other malformation of placenta, unspecified trimester: Secondary | ICD-10-CM | POA: Diagnosis present

## 2022-04-05 DIAGNOSIS — O99013 Anemia complicating pregnancy, third trimester: Secondary | ICD-10-CM

## 2022-04-05 DIAGNOSIS — O43192 Other malformation of placenta, second trimester: Secondary | ICD-10-CM | POA: Insufficient documentation

## 2022-04-05 DIAGNOSIS — O365930 Maternal care for other known or suspected poor fetal growth, third trimester, not applicable or unspecified: Secondary | ICD-10-CM | POA: Diagnosis not present

## 2022-04-05 DIAGNOSIS — O43193 Other malformation of placenta, third trimester: Secondary | ICD-10-CM

## 2022-04-05 DIAGNOSIS — O365921 Maternal care for other known or suspected poor fetal growth, second trimester, fetus 1: Secondary | ICD-10-CM | POA: Diagnosis present

## 2022-04-05 DIAGNOSIS — D649 Anemia, unspecified: Secondary | ICD-10-CM

## 2022-04-05 DIAGNOSIS — Z3A28 28 weeks gestation of pregnancy: Secondary | ICD-10-CM | POA: Diagnosis not present

## 2022-04-12 ENCOUNTER — Ambulatory Visit (INDEPENDENT_AMBULATORY_CARE_PROVIDER_SITE_OTHER): Payer: Medicaid Other | Admitting: Family Medicine

## 2022-04-12 VITALS — BP 119/67 | HR 62 | Wt 172.0 lb

## 2022-04-12 DIAGNOSIS — Z3A30 30 weeks gestation of pregnancy: Secondary | ICD-10-CM

## 2022-04-12 DIAGNOSIS — O0933 Supervision of pregnancy with insufficient antenatal care, third trimester: Secondary | ICD-10-CM

## 2022-04-12 DIAGNOSIS — O43199 Other malformation of placenta, unspecified trimester: Secondary | ICD-10-CM

## 2022-04-12 DIAGNOSIS — Z3400 Encounter for supervision of normal first pregnancy, unspecified trimester: Secondary | ICD-10-CM

## 2022-04-12 NOTE — Progress Notes (Signed)
   PRENATAL VISIT NOTE  Subjective:  Jo Sloan is a 21 y.o. G1P0 at [redacted]w[redacted]d being seen today for ongoing prenatal care.  She is currently monitored for the following issues for this high-risk pregnancy and has Supervision of normal first pregnancy; Marginal insertion of umbilical cord affecting management of mother; and Limited prenatal care in third trimester on their problem list.  Patient reports occasional contractions.  Contractions: Irritability. Vag. Bleeding: None.  Movement: Present. Denies leaking of fluid.   The following portions of the patient's history were reviewed and updated as appropriate: allergies, current medications, past family history, past medical history, past social history, past surgical history and problem list.   Objective:   Vitals:   04/12/22 0937  BP: 119/67  Pulse: 62  Weight: 172 lb (78 kg)    Fetal Status: Fetal Heart Rate (bpm): 142   Movement: Present     General:  Alert, oriented and cooperative. Patient is in no acute distress.  Skin: Skin is warm and dry. No rash noted.   Cardiovascular: Normal heart rate noted  Respiratory: Normal respiratory effort, no problems with respiration noted  Abdomen: Soft, gravid, appropriate for gestational age.  Pain/Pressure: Present     Pelvic: Cervical exam deferred        Extremities: Normal range of motion.  Edema: None  Mental Status: Normal mood and affect. Normal behavior. Normal judgment and thought content.   Assessment and Plan:  Pregnancy: G1P0 at [redacted]w[redacted]d 1. [redacted] weeks gestation of pregnancy  2. Supervision of normal first pregnancy, antepartum FHT and FH normal  3. Marginal insertion of umbilical cord affecting management of mother Growth normal  4. Limited prenatal care in third trimester  Preterm labor symptoms and general obstetric precautions including but not limited to vaginal bleeding, contractions, leaking of fluid and fetal movement were reviewed in detail with the  patient. Please refer to After Visit Summary for other counseling recommendations.   No follow-ups on file.  Future Appointments  Date Time Provider Department Center  04/24/2022 10:35 AM Warden Fillers, MD [facility] None  05/10/2022 10:35 AM Levie Heritage, DO [facility] None  05/17/2022 11:15 AM Levie Heritage, DO [facility] None  05/24/2022 10:35 AM Levie Heritage, DO [facility] None  05/31/2022 11:15 AM Levie Heritage, DO [facility] None  05/31/2022  1:15 PM [facility] NURSE [facility]  05/31/2022  1:30 PM [facility] US2 [facility]  06/07/2022  9:55 AM Levie Heritage, DO [facility] None    Levie Heritage, DO

## 2022-04-24 ENCOUNTER — Telehealth (INDEPENDENT_AMBULATORY_CARE_PROVIDER_SITE_OTHER): Payer: Medicaid Other | Admitting: Obstetrics and Gynecology

## 2022-04-24 DIAGNOSIS — O43193 Other malformation of placenta, third trimester: Secondary | ICD-10-CM

## 2022-04-24 DIAGNOSIS — Z3A32 32 weeks gestation of pregnancy: Secondary | ICD-10-CM

## 2022-04-24 DIAGNOSIS — O43199 Other malformation of placenta, unspecified trimester: Secondary | ICD-10-CM

## 2022-04-24 DIAGNOSIS — O0933 Supervision of pregnancy with insufficient antenatal care, third trimester: Secondary | ICD-10-CM

## 2022-04-24 DIAGNOSIS — Z3403 Encounter for supervision of normal first pregnancy, third trimester: Secondary | ICD-10-CM

## 2022-04-24 NOTE — Progress Notes (Signed)
OBSTETRICS PRENATAL VIRTUAL VISIT ENCOUNTER NOTE  Provider location: Center for [facility] at [facility]   Patient location: Home  I connected with Jo Sloan on 04/24/22 at 10:35 AM EDT by MyChart Video Encounter and verified that I am speaking with the correct person using two identifiers. I discussed the limitations, risks, security and privacy concerns of performing an evaluation and management service virtually and the availability of in person appointments. I also discussed with the patient that there may be a patient responsible charge related to this service. The patient expressed understanding and agreed to proceed. Subjective:  Jo Sloan is a 21 y.o. G1P0 at [redacted]w[redacted]d being seen today for ongoing prenatal care.  She is currently monitored for the following issues for this low-risk pregnancy and has Supervision of normal first pregnancy; Marginal insertion of umbilical cord affecting management of mother; and Limited prenatal care in third trimester on their problem list.  Patient reports occasional contractions.  Contractions: Irritability. Vag. Bleeding: None.  Movement: Present. Denies any leaking of fluid.   The following portions of the patient's history were reviewed and updated as appropriate: allergies, current medications, past family history, past medical history, past social history, past surgical history and problem list.   Objective:  There were no vitals filed for this visit.  Fetal Status:     Movement: Present     General:  Alert, oriented and cooperative. Patient is in no acute distress.  Respiratory: Normal respiratory effort, no problems with respiration noted  Mental Status: Normal mood and affect. Normal behavior. Normal judgment and thought content.  Rest of physical exam deferred due to type of encounter  Imaging: [place] MFM OB FOLLOW UP  Result Date:  04/05/2022 ----------------------------------------------------------------------  OBSTETRICS REPORT                       (Signed Final 04/05/2022 04:32 pm) ---------------------------------------------------------------------- Patient Info  ID #:       [id number]                          D.O.B.:  2001/03/14 (21 yrs)  Name:       Jo Sloan               Visit Date: 04/05/2022 08:39 am ---------------------------------------------------------------------- Performed By  Attending:        Lin Landsman      Secondary Phy.:   Rhona Raider                    MD                                                             STINSON MD  Performed By:     Charlyne Petrin     Address:          [address] [place]                    [address]                                                             [place]                                                             [id number]  Referred By:      [facility] MAU/Triage         [facility]         [facility] ---------------------------------------------------------------------- Orders  #  Description                           Code        Ordered By  1  [place] MFM OB FOLLOW UP                   76816.01    YU FANG ----------------------------------------------------------------------  #  Order #                     Accession #                Episode #  1  [id number]                   [id number]                 [id number] ---------------------------------------------------------------------- Indications  Marginal insertion of umbilical cord affecting O43.193  management of mother in third trimester  Late to prenatal care, third trimester         O09.33  Anemia during pregnancy in third trimester     O99.013   Negative MaterniT21  [redacted] weeks gestation of pregnancy                Z3A.28 ---------------------------------------------------------------------- Vital Signs                            Pulse:  74  BP:          128/63 ---------------------------------------------------------------------- Fetal Evaluation  Num Of Fetuses:         1  Fetal Heart Rate(bpm):  131  Cardiac Activity:       Observed  Presentation:           Breech  Placenta:               Anterior  P. Cord Insertion:      Marginal insertion  Amniotic Fluid  AFI FV:      Within normal limits  AFI Sum(cm)     %Tile       Largest Pocket(cm)  16.55           61          6.96  RUQ(cm)       RLQ(cm)       LUQ(cm)        LLQ(cm)  6.96          0             5.4            4.19 ---------------------------------------------------------------------- Biometry  BPD:     75.06  mm     G. Age:  30w 1d         77  %    CI:        76.82   %    70 - 86                                                          FL/HC:      20.1   %    19.6 - 20.8  HC:    271.25   mm     G. Age:  29w 4d         39  %    HC/AC:      1.10        0.99 - 1.21  AC:    246.74   mm     G. Age:  28w 6d         45  %    FL/BPD:     72.5   %    71 - 87  FL:      54.39  mm     G. Age:  28w 5d         32  %    FL/AC:      22.0   %    20 - 24  HUM:      48.4  mm     G. Age:  28w 3d         35  %  CER:      34.1  mm     G. Age:  28w 6d         54  %  LV:          3  mm  CM:        8.3  mm  Est. FW:    1320  gm    2 lb 15 oz      42  % ---------------------------------------------------------------------- OB History  Gravidity:    1         Term:  0        Prem:   0        SAB:   0  TOP:          0       Ectopic:  0        Living: 0 ---------------------------------------------------------------------- Gestational Age  LMP:           28w 6d        Date:  09/15/21                  EDD:   06/22/22  Clinical EDD:  29w 6d                                        EDD:   06/15/22  U/S Today:     29w 2d                                         EDD:   06/19/22  Best:          28w 6d     Det. By:  U/S  (03/13/22)          EDD:   06/22/22 ---------------------------------------------------------------------- Anatomy  Cranium:               Appears normal         LVOT:                   Previously seen  Cavum:                 Appears normal         Aortic Arch:            Appears normal  Ventricles:            Appears normal         Ductal Arch:            Appears normal  Choroid Plexus:        Previously seen        Diaphragm:              Appears normal  Cerebellum:            Appears normal         Stomach:                Appears normal, left                                                                        sided  Posterior Fossa:       Appears normal         Abdomen:                Appears normal  Nuchal Fold:           Not applicable (>20    Abdominal Wall:         Previously seen  wks GA)  Face:                  Appears normal         Cord Vessels:           Appears normal (3                         (orbits and profile)                           vessel cord)  Lips:                  Appears normal         Kidneys:                Appear normal  Palate:                Not well visualized    Bladder:                Appears normal  Thoracic:              Appears normal         Spine:                  Appears normal  Heart:                 Appears normal         Upper Extremities:      Previously seen                         (4CH, axis, and                         situs)  RVOT:                  Previously seen        Lower Extremities:      Previously seen  Other:  Heels/feet and open hands visualized. Female gender previously          seen. Nasal bone, lenses, VC, 3VV and 3VTV previously visualized. ---------------------------------------------------------------------- Cervix Uterus Adnexa  Cervix  Not visualized (advanced GA >24wks)  Uterus  Normal shape and size.  Right Ovary  Within normal limits.  Left Ovary   Within normal limits. ---------------------------------------------------------------------- Impression  Follow up growth due to marginal cord insertion.  Normal interval growth with measurements consistent with  dates  Good fetal movement and amniotic fluid volume ---------------------------------------------------------------------- Recommendations  Follow up growth given MCI in 8 weeks. ----------------------------------------------------------------------              Lin Landsman, MD Electronically Signed Final Report   04/05/2022 04:32 pm ----------------------------------------------------------------------   Assessment and Plan:  Pregnancy: G1P0 at [redacted]w[redacted]d 1. Marginal insertion of umbilical cord affecting management of mother Pt has follow up ultrasound on 05/31/22  2. Limited prenatal care in third trimester   3. [redacted] weeks gestation of pregnancy   4. Encounter for supervision of normal first pregnancy in third trimester Continue routine prenatal care. Pt had no acute questions or concerns. She did note 1 episode of vomiting a few days ago, but none since  Preterm labor symptoms and general obstetric precautions including but not limited to vaginal bleeding, contractions, leaking of fluid and fetal movement were reviewed in detail with the  patient. I discussed the assessment and treatment plan with the patient. The patient was provided an opportunity to ask questions and all were answered. The patient agreed with the plan and demonstrated an understanding of the instructions. The patient was advised to call back or seek an in-person office evaluation/go to MAU at [facility] for any urgent or concerning symptoms. Please refer to After Visit Summary for other counseling recommendations.   I provided 10 minutes of face-to-face time during this encounter.  Return in about 2 weeks (around 05/08/2022) for ROB, virtual.  Future Appointments  Date Time Provider Department  Center  05/10/2022 10:35 AM Levie Heritage, DO [facility] None  05/17/2022 11:15 AM Levie Heritage, DO [facility] None  05/24/2022 10:35 AM Levie Heritage, DO [facility] None  05/31/2022 11:15 AM Levie Heritage, DO [facility] None  05/31/2022  1:15 PM [facility] NURSE [facility]  05/31/2022  1:30 PM [facility] US2 [facility]  06/07/2022  9:55 AM Adrian Blackwater, Rhona Raider, DO [facility] None    Warden Fillers, MD Center for [facility], [facility]

## 2022-05-10 ENCOUNTER — Ambulatory Visit (INDEPENDENT_AMBULATORY_CARE_PROVIDER_SITE_OTHER): Payer: Medicaid Other | Admitting: Family Medicine

## 2022-05-10 VITALS — BP 122/64 | HR 62 | Wt 181.0 lb

## 2022-05-10 DIAGNOSIS — Z3A34 34 weeks gestation of pregnancy: Secondary | ICD-10-CM

## 2022-05-10 DIAGNOSIS — O0933 Supervision of pregnancy with insufficient antenatal care, third trimester: Secondary | ICD-10-CM

## 2022-05-10 DIAGNOSIS — Z3403 Encounter for supervision of normal first pregnancy, third trimester: Secondary | ICD-10-CM

## 2022-05-10 DIAGNOSIS — O43199 Other malformation of placenta, unspecified trimester: Secondary | ICD-10-CM

## 2022-05-10 NOTE — Progress Notes (Signed)
   PRENATAL VISIT NOTE  Subjective:  Jo Sloan is a 21 y.o. G1P0 at [redacted]w[redacted]d being seen today for ongoing prenatal care.  She is currently monitored for the following issues for this low-risk pregnancy and has Supervision of normal first pregnancy; Marginal insertion of umbilical cord affecting management of mother; and Limited prenatal care in third trimester on their problem list.  Patient reports bilateral rib pain, mild lower abdominal pain, nausea and no bleeding.  Contractions: Irritability. Vag. Bleeding: None.  Movement: Present. Denies leaking of fluid.   The following portions of the patient's history were reviewed and updated as appropriate: allergies, current medications, past family history, past medical history, past social history, past surgical history and problem list.   Objective:   Vitals:   05/10/22 1051  BP: 122/64  Pulse: 62  Weight: 181 lb (82.1 kg)    Fetal Status: Fetal Heart Rate (bpm): 138   Movement: Present     General:  Alert, oriented and cooperative. Patient is in no acute distress.  Skin: Skin is warm and dry. No rash noted.   Cardiovascular: Normal heart rate noted  Respiratory: Normal respiratory effort, no problems with respiration noted  Abdomen: Soft, gravid, appropriate for gestational age.  Pain/Pressure: Present     Pelvic: Cervical exam deferred        Extremities: Normal range of motion.  Edema: None  Mental Status: Normal mood and affect. Normal behavior. Normal judgment and thought content.   Assessment and Plan:  Pregnancy: G1P0 at [redacted]w[redacted]d 1. [redacted] weeks gestation of pregnancy Prenatal vitamins. Mild anemia back in June. Recommended iron supplement to be taken with orange juice. Ultrasound results discussed Patient no longer wanting water birth  Preterm labor symptoms and general obstetric precautions including but not limited to vaginal bleeding, contractions, leaking of fluid and fetal movement were reviewed in detail with the  patient. Please refer to After Visit Summary for other counseling recommendations.   No follow-ups on file.  Future Appointments  Date Time Provider Department Center  05/17/2022 11:15 AM Levie Heritage, DO [facility] None  05/24/2022 10:35 AM Levie Heritage, DO [facility] None  05/31/2022 11:15 AM Levie Heritage, DO [facility] None  05/31/2022  1:15 PM [facility] NURSE [facility]  05/31/2022  1:30 PM [facility] US2 [facility]  06/07/2022  9:55 AM [place], Rhona Raider, DO [facility] None    Bishop Limbo, Medical Student  Attestation of Supervision of Student:  I confirm that I have verified the information documented in the medical student's note and that I have also personally reperformed the history, physical exam and all medical decision making activities.  I have verified that all services and findings are accurately documented in this student's note; and I agree with management and plan as outlined in the documentation. I have also made any necessary editorial changes.  Patient having bilateral rib pain.  This is likely secondary to abdominal muscle stretching.  Fundal height 34 cm.  Fetal heart rate normal at 138.  Patient's blood pressure and weight reviewed.  1. [redacted] weeks gestation of pregnancy  2. Encounter for supervision of normal first pregnancy in third trimester Fundal height tracing normal.  Oral iron for anemia  3. Marginal insertion of umbilical cord affecting management of mother Normal fetal growth  4. Limited prenatal care in third trimester    Levie Heritage, DO Center for [employer], [facility] 05/10/2022 12:21 PM

## 2022-05-17 ENCOUNTER — Encounter: Payer: Medicaid Other | Admitting: Family Medicine

## 2022-05-24 ENCOUNTER — Other Ambulatory Visit (HOSPITAL_COMMUNITY)
Admission: RE | Admit: 2022-05-24 | Discharge: 2022-05-24 | Disposition: A | Discharge status: Home / Self Care | Payer: Medicaid Other | Source: Ambulatory Visit | Attending: Family Medicine | Admitting: Family Medicine

## 2022-05-24 ENCOUNTER — Ambulatory Visit (INDEPENDENT_AMBULATORY_CARE_PROVIDER_SITE_OTHER): Payer: Medicaid Other | Admitting: Family Medicine

## 2022-05-24 VITALS — BP 118/67 | HR 63 | Wt 184.0 lb

## 2022-05-24 DIAGNOSIS — O43193 Other malformation of placenta, third trimester: Secondary | ICD-10-CM

## 2022-05-24 DIAGNOSIS — N898 Other specified noninflammatory disorders of vagina: Secondary | ICD-10-CM | POA: Diagnosis present

## 2022-05-24 DIAGNOSIS — O0933 Supervision of pregnancy with insufficient antenatal care, third trimester: Secondary | ICD-10-CM

## 2022-05-24 DIAGNOSIS — Z3400 Encounter for supervision of normal first pregnancy, unspecified trimester: Secondary | ICD-10-CM | POA: Diagnosis not present

## 2022-05-24 DIAGNOSIS — Z3403 Encounter for supervision of normal first pregnancy, third trimester: Secondary | ICD-10-CM

## 2022-05-24 DIAGNOSIS — Z3A36 36 weeks gestation of pregnancy: Secondary | ICD-10-CM

## 2022-05-24 DIAGNOSIS — O43199 Other malformation of placenta, unspecified trimester: Secondary | ICD-10-CM

## 2022-05-24 NOTE — Progress Notes (Signed)
   PRENATAL VISIT NOTE  Subjective:  Jo Sloan is a 21 y.o. G1P0 at [redacted]w[redacted]d being seen today for ongoing prenatal care.  She is currently monitored for the following issues for this high-risk pregnancy and has Supervision of normal first pregnancy; Marginal insertion of umbilical cord affecting management of mother; and Limited prenatal care in third trimester on their problem list.  Patient reports no complaints.  Contractions: Irritability. Vag. Bleeding: None.  Movement: Present. Denies leaking of fluid.   The following portions of the patient's history were reviewed and updated as appropriate: allergies, current medications, past family history, past medical history, past social history, past surgical history and problem list.   Objective:   Vitals:   05/24/22 1100  BP: 118/67  Pulse: 63  Weight: 184 lb (83.5 kg)    Fetal Status: Fetal Heart Rate (bpm): 139   Movement: Present     General:  Alert, oriented and cooperative. Patient is in no acute distress.  Skin: Skin is warm and dry. No rash noted.   Cardiovascular: Normal heart rate noted  Respiratory: Normal respiratory effort, no problems with respiration noted  Abdomen: Soft, gravid, appropriate for gestational age.  Pain/Pressure: Present     Pelvic: Cervical exam deferred        Extremities: Normal range of motion.  Edema: Trace  Mental Status: Normal mood and affect. Normal behavior. Normal judgment and thought content.   Assessment and Plan:  Pregnancy: G1P0 at [redacted]w[redacted]d 1. Supervision of normal first pregnancy, antepartum FHT and FH normal - Culture, beta strep (group b only)  2. Vaginal discharge - Cervicovaginal ancillary only( [place])  3. Vaginal odor - Cervicovaginal ancillary only( [place])  4. Marginal insertion of umbilical cord affecting management of mother Has growth [place] next week  5. Limited prenatal care in third trimester  Preterm labor symptoms and general obstetric precautions  including but not limited to vaginal bleeding, contractions, leaking of fluid and fetal movement were reviewed in detail with the patient. Please refer to After Visit Summary for other counseling recommendations.   No follow-ups on file.  Future Appointments  Date Time Provider Department Center  05/31/2022  1:15 PM [facility] NURSE [facility]  05/31/2022  1:30 PM [facility] US2 [facility]  06/02/2022  9:15 AM Levie Heritage, DO [facility] None  06/09/2022  9:15 AM Levie Heritage, DO [facility] None    Levie Heritage, DO

## 2022-05-25 LAB — CERVICOVAGINAL ANCILLARY ONLY
Bacterial Vaginitis (gardnerella): POSITIVE — AB
Candida Glabrata: NEGATIVE
Candida Vaginitis: POSITIVE — AB
Chlamydia: NEGATIVE
Comment: NEGATIVE
Comment: NEGATIVE
Comment: NEGATIVE
Comment: NEGATIVE
Comment: NORMAL
Neisseria Gonorrhea: NEGATIVE

## 2022-05-26 MED ORDER — METRONIDAZOLE 500 MG PO TABS: 500.0000 mg | ORAL_TABLET | Freq: Two times a day (BID) | ORAL | 0 refills | Status: DC

## 2022-05-26 MED ORDER — FLUCONAZOLE 150 MG PO TABS: 150.0000 mg | ORAL_TABLET | Freq: Once | ORAL | 0 refills | Status: AC

## 2022-05-26 NOTE — Addendum Note (Signed)
Addended by: Levie Heritage on: 05/26/2022 11:59 AM   Modules accepted: Orders

## 2022-05-28 LAB — CULTURE, BETA STREP (GROUP B ONLY): Strep Gp B Culture: NEGATIVE

## 2022-05-31 ENCOUNTER — Ambulatory Visit: Payer: Medicaid Other

## 2022-05-31 ENCOUNTER — Encounter: Payer: Medicaid Other | Admitting: Family Medicine

## 2022-06-02 ENCOUNTER — Ambulatory Visit: Payer: Medicaid Other | Attending: Maternal & Fetal Medicine

## 2022-06-02 ENCOUNTER — Ambulatory Visit: Payer: Medicaid Other | Admitting: *Deleted

## 2022-06-02 ENCOUNTER — Ambulatory Visit (INDEPENDENT_AMBULATORY_CARE_PROVIDER_SITE_OTHER): Payer: Medicaid Other | Admitting: Family Medicine

## 2022-06-02 VITALS — BP 126/77 | Temp 65.0°F

## 2022-06-02 VITALS — BP 128/66 | HR 62 | Wt 189.0 lb

## 2022-06-02 DIAGNOSIS — O43199 Other malformation of placenta, unspecified trimester: Secondary | ICD-10-CM

## 2022-06-02 DIAGNOSIS — D649 Anemia, unspecified: Secondary | ICD-10-CM

## 2022-06-02 DIAGNOSIS — O0933 Supervision of pregnancy with insufficient antenatal care, third trimester: Secondary | ICD-10-CM

## 2022-06-02 DIAGNOSIS — Z3A37 37 weeks gestation of pregnancy: Secondary | ICD-10-CM | POA: Diagnosis not present

## 2022-06-02 DIAGNOSIS — O43193 Other malformation of placenta, third trimester: Secondary | ICD-10-CM | POA: Diagnosis not present

## 2022-06-02 DIAGNOSIS — O99013 Anemia complicating pregnancy, third trimester: Secondary | ICD-10-CM | POA: Insufficient documentation

## 2022-06-02 DIAGNOSIS — Z3403 Encounter for supervision of normal first pregnancy, third trimester: Secondary | ICD-10-CM

## 2022-06-02 NOTE — Progress Notes (Signed)
   PRENATAL VISIT NOTE  Subjective:  Jo Sloan is a 21 y.o. G1P0 at [redacted]w[redacted]d being seen today for ongoing prenatal care.  She is currently monitored for the following issues for this high-risk pregnancy and has Supervision of normal first pregnancy; Marginal insertion of umbilical cord affecting management of mother; and Limited prenatal care in third trimester on their problem list.  Patient reports no complaints.  Contractions: Irritability. Vag. Bleeding: None.  Movement: Present. Denies leaking of fluid.   The following portions of the patient's history were reviewed and updated as appropriate: allergies, current medications, past family history, past medical history, past social history, past surgical history and problem list.   Objective:   Vitals:   06/02/22 0918  BP: 128/66  Pulse: 62  Weight: 189 lb (85.7 kg)    Fetal Status: Fetal Heart Rate (bpm): 138   Movement: Present     General:  Alert, oriented and cooperative. Patient is in no acute distress.  Skin: Skin is warm and dry. No rash noted.   Cardiovascular: Normal heart rate noted  Respiratory: Normal respiratory effort, no problems with respiration noted  Abdomen: Soft, gravid, appropriate for gestational age.  Pain/Pressure: Present     Pelvic: Cervical exam deferred        Extremities: Normal range of motion.  Edema: None  Mental Status: Normal mood and affect. Normal behavior. Normal judgment and thought content.   Assessment and Plan:  Pregnancy: G1P0 at [redacted]w[redacted]d 1. Encounter for supervision of normal first pregnancy in third trimester  2. Marginal insertion of umbilical cord affecting management of mother Has [place] today. Growth has been normal so far.  3. Limited prenatal care in third trimester   Term labor symptoms and general obstetric precautions including but not limited to vaginal bleeding, contractions, leaking of fluid and fetal movement were reviewed in detail with the patient. Please refer to  After Visit Summary for other counseling recommendations.   No follow-ups on file.  Future Appointments  Date Time Provider Department Center  06/02/2022  1:30 PM [facility] NURSE [facility]  06/02/2022  1:45 PM [facility] US4 [facility]  06/09/2022  9:15 AM Levie Heritage, DO [facility] None    Levie Heritage, DO

## 2022-06-07 ENCOUNTER — Encounter: Payer: Medicaid Other | Admitting: Family Medicine

## 2022-06-08 DIAGNOSIS — H5213 Myopia, bilateral: Secondary | ICD-10-CM | POA: Diagnosis not present

## 2022-06-09 ENCOUNTER — Telehealth (INDEPENDENT_AMBULATORY_CARE_PROVIDER_SITE_OTHER): Payer: Medicaid Other | Admitting: Family Medicine

## 2022-06-09 DIAGNOSIS — O43199 Other malformation of placenta, unspecified trimester: Secondary | ICD-10-CM

## 2022-06-09 DIAGNOSIS — Z3400 Encounter for supervision of normal first pregnancy, unspecified trimester: Secondary | ICD-10-CM

## 2022-06-09 DIAGNOSIS — Z3A38 38 weeks gestation of pregnancy: Secondary | ICD-10-CM

## 2022-06-09 DIAGNOSIS — O0933 Supervision of pregnancy with insufficient antenatal care, third trimester: Secondary | ICD-10-CM

## 2022-06-09 DIAGNOSIS — O43193 Other malformation of placenta, third trimester: Secondary | ICD-10-CM

## 2022-06-09 NOTE — Progress Notes (Signed)
OBSTETRICS PRENATAL VIRTUAL VISIT ENCOUNTER NOTE  Provider location: Center for [facility] at [facility]   Patient location: Home  I connected with Jo Sloan on 06/09/22 at  9:15 AM EDT by MyChart Video Encounter and verified that I am speaking with the correct person using two identifiers. I discussed the limitations, risks, security and privacy concerns of performing an evaluation and management service virtually and the availability of in person appointments. I also discussed with the patient that there may be a patient responsible charge related to this service. The patient expressed understanding and agreed to proceed. Subjective:  Jo Sloan is a 21 y.o. G1P0 at [redacted]w[redacted]d being seen today for ongoing prenatal care.  She is currently monitored for the following issues for this low-risk pregnancy and has Supervision of normal first pregnancy; Marginal insertion of umbilical cord affecting management of mother; and Limited prenatal care in third trimester on their problem list.  Patient reports  cold symptoms with sinus pressure. No fevers or chills. Has been using nasal spray and tylenol. Took COVID test, which was negative. Starting to feel better .  Contractions: Irritability. Vag. Bleeding: None.  Movement: Present. Denies any leaking of fluid.   The following portions of the patient's history were reviewed and updated as appropriate: allergies, current medications, past family history, past medical history, past social history, past surgical history and problem list.   Objective:  There were no vitals filed for this visit.  Fetal Status:     Movement: Present     General:  Alert, oriented and cooperative. Patient is in no acute distress.  Respiratory: Normal respiratory effort, no problems with respiration noted  Mental Status: Normal mood and affect. Normal behavior. Normal judgment and thought content.  Rest of physical exam deferred due to type of  encounter  Imaging: [place] MFM OB FOLLOW UP  Result Date: 06/02/2022 ----------------------------------------------------------------------  OBSTETRICS REPORT                       (Signed Final 06/02/2022 02:40 pm) ---------------------------------------------------------------------- Patient Info  ID #:       [id number]                          D.O.B.:  12-17-2000 (21 yrs)  Name:       Jo Sloan               Visit Date: 06/02/2022 01:43 pm ---------------------------------------------------------------------- Performed By  Attending:        Ma Rings MD         Secondary Phy.:   Levie Heritage MD  Performed By:     Eden Lathe BS      Address:          [address]                    [address]  [place], [place]                                                             [address]  Referred By:      [facility]         Location:         [facility]                                                             [facility] at                                                             [facility]                                                             Women  Ref. Address:     [address] ---------------------------------------------------------------------- Orders  #  Description                           Code        Ordered By  1  [place] MFM OB FOLLOW UP                   19147.82    Lin Landsman ----------------------------------------------------------------------  #  Order #                     Accession #                Episode #  1  [id number]                   [id number]                 [id number] ---------------------------------------------------------------------- Indications  Marginal insertion of umbilical cord affecting O43.193  management of mother in third trimester  Late to prenatal  care, third trimester         O09.33  [redacted] weeks gestation of pregnancy                Z3A.37  Anemia during pregnancy in third trimester     O99.013  Negative MaterniT21  Encounter for other antenatal screening  Z36.2  follow-up ---------------------------------------------------------------------- Fetal Evaluation  Num Of Fetuses:         1  Fetal Heart Rate(bpm):  143  Cardiac Activity:       Observed  Presentation:           Cephalic  Placenta:               Anterior  P. Cord Insertion:      Marginal insertion prev vis  Amniotic Fluid  AFI FV:      Within normal limits  AFI Sum(cm)     %Tile       Largest Pocket(cm)  11.69           37          3.27  RUQ(cm)       RLQ(cm)       LUQ(cm)        LLQ(cm)  2.54          3.18          3.27           2.7 ---------------------------------------------------------------------- Biometry  BPD:     91.92  mm     G. Age:  37w 2d         71  %    CI:        80.11   %    70 - 86                                                          FL/HC:      20.7   %    20.8 - 22.6  HC:    324.47   mm     G. Age:  36w 5d         17  %    HC/AC:      1.01        0.92 - 1.05  AC:    320.65   mm     G. Age:  36w 0d         31  %    FL/BPD:     73.1   %    71 - 87  FL:      67.21  mm     G. Age:  34w 4d        3.6  %    FL/AC:      21.0   %    20 - 24  LV:        1.6  mm  Est. FW:    2781  gm      6 lb 2 oz     24  % ---------------------------------------------------------------------- OB History  Gravidity:    1         Term:   0        Prem:   0        SAB:   0  TOP:          0       Ectopic:  0        Living: 0 ---------------------------------------------------------------------- Gestational Age  LMP:           37w 1d        Date:  09/15/21  EDD:   06/22/22  Clinical EDD:  38w 1d                                        EDD:   06/15/22  U/S Today:     36w 1d                                        EDD:   06/29/22  Best:          37w 1d     Det. By:  U/S  (03/13/22)           EDD:   06/22/22 ---------------------------------------------------------------------- Anatomy  Cranium:               Appears normal         Aortic Arch:            Previously seen  Cavum:                 Appears normal         Ductal Arch:            Previously seen  Ventricles:            Appears normal         Diaphragm:              Previously seen  Choroid Plexus:        Previously seen        Stomach:                Appears normal, left                                                                        sided  Cerebellum:            Previously seen        Abdomen:                Appears normal  Posterior Fossa:       Previously seen        Abdominal Wall:         Previously seen  Nuchal Fold:           Not applicable (>20    Cord Vessels:           Previously seen                         wks GA)  Face:                  Appears normal         Kidneys:                Appear normal                         (orbits and profile)  Lips:                  Appears  normal         Bladder:                Appears normal  Thoracic:              Appears normal         Spine:                  Previously seen  Heart:                 Previously seen        Upper Extremities:      Previously seen  RVOT:                  Previously seen        Lower Extremities:      Previously seen  LVOT:                  Previously seen  Other:  Heels/feet and open hands previously visualized. Female gender          previously seen. Nasal bone, lenses, VC, 3VV and 3VTV previously          visualized. ---------------------------------------------------------------------- Cervix Uterus Adnexa  Cervix  Not visualized (advanced GA >24wks)  Uterus  No abnormality visualized.  Right Ovary  Not visualized.  Left Ovary  Not visualized.  Cul De Sac  No free fluid seen.  Adnexa  No abnormality visualized. ---------------------------------------------------------------------- Comments  This patient was seen for a follow up growth scan due to a   marginal placental cord insertion.  Denies any problems since  her last exam.  [facility],  the fetal growth and  amniotic fluid level appears appropriate for her gestational  age.  As the fetal growth is within normal limits, no further exams  were scheduled in our office. ----------------------------------------------------------------------                   Ma RingsVictor Fang, MD Electronically Signed Final Report   06/02/2022 02:40 pm ----------------------------------------------------------------------   Assessment and Plan:  Pregnancy: G1P0 at 4862w1d 1. Supervision of normal first pregnancy, antepartum FHT and FH normal. Has Sinus infection - likely viral.  Recommended mucinex, which should help to break up the mucus and help drainage. [facility] to wor[facility].  2. [redacted] weeks gestation of pregnancy  3. Marginal insertion of umbilical cord affecting management of mother Normal growth  4. Limited prenatal care in third trimester   Term labor symptoms and general obstetric precautions including but not limited to vaginal bleeding, contractions, leaking of fluid and fetal movement were reviewed in detail with the patient. I discussed the assessment and treatment plan with the patient. The patient was provided an opportunity to ask questions and all were answered. The patient agreed with the plan and demonstrated an understanding of the instructions. The patient was advised to call back or seek an in-person office evaluation/[facility] for any urgent or concerning symptoms. Please refer to After Visit Summary for other counseling recommendations.   I provided 7 minutes of face-to-face time during this encounter.  No follow-ups on file.  Future Appointments  Date Time Provider Department Center  06/09/2022  9:15 AM Levie HeritageStinson, Shateka Petrea J, DO [facility] None  06/16/2022 10:55 AM Milas Hockuncan, Paula, MD [facility] None    Levie HeritageJacob J Neshia Mckenzie,  DO [facility]

## 2022-06-09 NOTE — Progress Notes (Signed)
Patient having cold symptoms.

## 2022-06-16 ENCOUNTER — Ambulatory Visit (INDEPENDENT_AMBULATORY_CARE_PROVIDER_SITE_OTHER): Payer: Medicaid Other | Admitting: Obstetrics and Gynecology

## 2022-06-16 ENCOUNTER — Encounter: Payer: Self-pay | Admitting: Obstetrics and Gynecology

## 2022-06-16 VITALS — BP 128/81 | HR 82 | Wt 192.0 lb

## 2022-06-16 DIAGNOSIS — Z23 Encounter for immunization: Secondary | ICD-10-CM | POA: Diagnosis not present

## 2022-06-16 DIAGNOSIS — Z3A39 39 weeks gestation of pregnancy: Secondary | ICD-10-CM

## 2022-06-16 DIAGNOSIS — O43193 Other malformation of placenta, third trimester: Secondary | ICD-10-CM

## 2022-06-16 DIAGNOSIS — Z3403 Encounter for supervision of normal first pregnancy, third trimester: Secondary | ICD-10-CM

## 2022-06-16 DIAGNOSIS — O43199 Other malformation of placenta, unspecified trimester: Secondary | ICD-10-CM

## 2022-06-16 NOTE — Patient Instructions (Signed)
Congratulations, you're on your way to having your baby!!!   You now have an induction scheduled.   If your induction is scheduled for midnight, you will arrive at 11:45pm on the date provided on the form form the clinical staff today. You will arrive at the [facility] at [facility] (Entrance C) and tell them you are there for your induction. The hospital will only call you to not come if they have NO beds available and need to postpone your admission time.    If your induction is scheduled for the daytime, you will see an appointment time in MyChart. Please DO NOT show up at this time, this is just a placeholder on the schedule. You will get a call when your room is ready and will have 2 hours to arrive. The hospital staff can call anytime starting at 5 am through the rest of the day.   You will also get a call from the pre-admission nurse to go over pre-admission screen about 2 days prior to your induction date.      

## 2022-06-16 NOTE — Progress Notes (Signed)
   PRENATAL VISIT NOTE  Subjective:  Jo Sloan is a 21 y.o. G1P0 at [redacted]w[redacted]d being seen today for ongoing prenatal care.  She is currently monitored for the following issues for this low-risk pregnancy and has Supervision of normal first pregnancy; Marginal insertion of umbilical cord affecting management of mother; and Limited prenatal care in third trimester on their problem list.  Patient reports no complaints.  Contractions: Irregular. Vag. Bleeding: None.  Movement: Present. Denies leaking of fluid.   The following portions of the patient's history were reviewed and updated as appropriate: allergies, current medications, past family history, past medical history, past social history, past surgical history and problem list.   Objective:   Vitals:   06/16/22 1109  BP: 128/81  Pulse: 82  Weight: 192 lb (87.1 kg)    Fetal Status: Fetal Heart Rate (bpm): 140 Fundal Height: 37 cm Movement: Present  Presentation: Vertex  General:  Alert, oriented and cooperative. Patient is in no acute distress.  Skin: Skin is warm and dry. No rash noted.   Cardiovascular: Normal heart rate noted  Respiratory: Normal respiratory effort, no problems with respiration noted  Abdomen: Soft, gravid, appropriate for gestational age.  Pain/Pressure: Present     Pelvic: Cervical exam deferred        Extremities: Normal range of motion.  Edema: Trace  Mental Status: Normal mood and affect. Normal behavior. Normal judgment and thought content.   Assessment and Plan:  Pregnancy: G1P0 at [redacted]w[redacted]d 1. Marginal insertion of umbilical cord affecting management of mother Normal growth on 8/25 of 24%ile Schedule for IOL on 9/14.   2. Encounter for supervision of normal first pregnancy in third trimester GBS neg  Term labor symptoms and general obstetric precautions including but not limited to vaginal bleeding, contractions, leaking of fluid and fetal movement were reviewed in detail with the patient. Please  refer to After Visit Summary for other counseling recommendations.   Return in about 1 week (around 06/23/2022) for for induction.  Future Appointments  Date Time Provider Department Center  06/22/2022  7:00 AM [facility] SCHED ROOM [facility] None    Milas Hock, MD

## 2022-06-20 ENCOUNTER — Other Ambulatory Visit: Payer: Self-pay | Admitting: Women's Health

## 2022-06-22 ENCOUNTER — Inpatient Hospital Stay (HOSPITAL_COMMUNITY): Payer: Medicaid Other

## 2022-06-22 ENCOUNTER — Inpatient Hospital Stay (HOSPITAL_COMMUNITY)
Admission: AD | Admit: 2022-06-22 | Payer: Medicaid Other | Source: Home / Self Care | Admitting: Obstetrics and Gynecology

## 2022-06-25 ENCOUNTER — Other Ambulatory Visit: Payer: Self-pay

## 2022-06-25 ENCOUNTER — Inpatient Hospital Stay (HOSPITAL_COMMUNITY): Payer: Medicaid Other

## 2022-06-25 ENCOUNTER — Encounter (HOSPITAL_COMMUNITY): Payer: Self-pay | Admitting: Family Medicine

## 2022-06-25 ENCOUNTER — Inpatient Hospital Stay (HOSPITAL_COMMUNITY)
Admission: AD | Admit: 2022-06-25 | Discharge: 2022-06-28 | DRG: 806 | Disposition: A | Discharge status: Home / Self Care | Payer: Medicaid Other | Attending: Family Medicine | Admitting: Family Medicine

## 2022-06-25 DIAGNOSIS — O43123 Velamentous insertion of umbilical cord, third trimester: Secondary | ICD-10-CM | POA: Diagnosis present

## 2022-06-25 DIAGNOSIS — Z3490 Encounter for supervision of normal pregnancy, unspecified, unspecified trimester: Secondary | ICD-10-CM

## 2022-06-25 DIAGNOSIS — Z3400 Encounter for supervision of normal first pregnancy, unspecified trimester: Secondary | ICD-10-CM

## 2022-06-25 DIAGNOSIS — O26893 Other specified pregnancy related conditions, third trimester: Secondary | ICD-10-CM | POA: Diagnosis not present

## 2022-06-25 DIAGNOSIS — R030 Elevated blood-pressure reading, without diagnosis of hypertension: Secondary | ICD-10-CM | POA: Diagnosis not present

## 2022-06-25 DIAGNOSIS — O43199 Other malformation of placenta, unspecified trimester: Secondary | ICD-10-CM

## 2022-06-25 DIAGNOSIS — Z3A40 40 weeks gestation of pregnancy: Secondary | ICD-10-CM | POA: Diagnosis not present

## 2022-06-25 DIAGNOSIS — Z3A39 39 weeks gestation of pregnancy: Secondary | ICD-10-CM

## 2022-06-25 DIAGNOSIS — O480 Post-term pregnancy: Principal | ICD-10-CM | POA: Diagnosis present

## 2022-06-25 DIAGNOSIS — O4423 Partial placenta previa NOS or without hemorrhage, third trimester: Secondary | ICD-10-CM | POA: Diagnosis not present

## 2022-06-25 DIAGNOSIS — R0602 Shortness of breath: Secondary | ICD-10-CM | POA: Diagnosis not present

## 2022-06-25 HISTORY — DX: Encounter for supervision of normal pregnancy, unspecified, unspecified trimester: Z34.90

## 2022-06-25 LAB — TYPE AND SCREEN
ABO/RH(D): O POS
Antibody Screen: NEGATIVE

## 2022-06-25 LAB — CBC
HCT: 33.3 % — ABNORMAL LOW (ref 36.0–46.0)
Hemoglobin: 11.1 g/dL — ABNORMAL LOW (ref 12.0–15.0)
MCH: 29.8 pg (ref 26.0–34.0)
MCHC: 33.3 g/dL (ref 30.0–36.0)
MCV: 89.3 fL (ref 80.0–100.0)
Platelets: 254 10*3/uL (ref 150–400)
RBC: 3.73 MIL/uL — ABNORMAL LOW (ref 3.87–5.11)
RDW: 15.7 % — ABNORMAL HIGH (ref 11.5–15.5)
WBC: 7.6 10*3/uL (ref 4.0–10.5)
nRBC: 0 % (ref 0.0–0.2)

## 2022-06-25 LAB — COMPREHENSIVE METABOLIC PANEL
ALT: 12 U/L (ref 0–44)
AST: 17 U/L (ref 15–41)
Albumin: 2.6 g/dL — ABNORMAL LOW (ref 3.5–5.0)
Alkaline Phosphatase: 158 U/L — ABNORMAL HIGH (ref 38–126)
Anion gap: 10 (ref 5–15)
BUN: 16 mg/dL (ref 6–20)
CO2: 21 mmol/L — ABNORMAL LOW (ref 22–32)
Calcium: 9.5 mg/dL (ref 8.9–10.3)
Chloride: 107 mmol/L (ref 98–111)
Creatinine, Ser: 0.74 mg/dL (ref 0.44–1.00)
GFR, Estimated: >60 mL/min (ref >60)
Glucose, Bld: 102 mg/dL — ABNORMAL HIGH (ref 70–99)
Potassium: 3.5 mmol/L (ref 3.5–5.1)
Sodium: 138 mmol/L (ref 135–145)
Total Bilirubin: 0.2 mg/dL — ABNORMAL LOW (ref 0.3–1.2)
Total Protein: 6.1 g/dL — ABNORMAL LOW (ref 6.5–8.1)

## 2022-06-25 LAB — PROTEIN / CREATININE RATIO, URINE
Creatinine, Urine: 89 mg/dL
Protein Creatinine Ratio: 0.07 mg/mg{Cre} (ref 0.00–0.15)
Total Protein, Urine: 6 mg/dL

## 2022-06-25 MED ORDER — OXYCODONE-ACETAMINOPHEN 5-325 MG PO TABS: 1.0000 | ORAL_TABLET | ORAL | Status: DC | PRN

## 2022-06-25 MED ORDER — OXYTOCIN-SODIUM CHLORIDE 30-0.9 UT/500ML-% IV SOLN
2.5000 [IU]/h | INTRAVENOUS | Status: DC
  Administered 2022-06-26: 2.5 [IU]/h via INTRAVENOUS

## 2022-06-25 MED ORDER — LIDOCAINE HCL (PF) 1 % IJ SOLN
30.0000 mL | INTRAMUSCULAR | Status: AC | PRN
  Administered 2022-06-26: 30 mL via SUBCUTANEOUS
  Filled 2022-06-25: qty 30

## 2022-06-25 MED ORDER — OXYTOCIN BOLUS FROM INFUSION
333.0000 mL | Freq: Once | INTRAVENOUS | Status: AC
  Administered 2022-06-26: 333 mL via INTRAVENOUS

## 2022-06-25 MED ORDER — TERBUTALINE SULFATE 1 MG/ML IJ SOLN: 0.2500 mg | Freq: Once | INTRAMUSCULAR | Status: DC | PRN

## 2022-06-25 MED ORDER — LACTATED RINGERS IV SOLN: INTRAVENOUS | Status: DC

## 2022-06-25 MED ORDER — SOD CITRATE-CITRIC ACID 500-334 MG/5ML PO SOLN: 30.0000 mL | ORAL | Status: DC | PRN

## 2022-06-25 MED ORDER — MISOPROSTOL 25 MCG QUARTER TABLET
25.0000 ug | ORAL_TABLET | Freq: Once | ORAL | Status: AC
  Administered 2022-06-25: 25 ug via VAGINAL
  Filled 2022-06-25: qty 1

## 2022-06-25 MED ORDER — OXYCODONE-ACETAMINOPHEN 5-325 MG PO TABS: 2.0000 | ORAL_TABLET | ORAL | Status: DC | PRN

## 2022-06-25 MED ORDER — ONDANSETRON HCL 4 MG/2ML IJ SOLN
4.0000 mg | Freq: Four times a day (QID) | INTRAMUSCULAR | Status: DC | PRN
  Administered 2022-06-26: 4 mg via INTRAVENOUS
  Filled 2022-06-25: qty 2

## 2022-06-25 MED ORDER — MISOPROSTOL 50MCG HALF TABLET
50.0000 ug | ORAL_TABLET | Freq: Once | ORAL | Status: AC
  Administered 2022-06-25: 50 ug via ORAL
  Filled 2022-06-25: qty 1

## 2022-06-25 MED ORDER — LACTATED RINGERS IV SOLN: 500.0000 mL | INTRAVENOUS | Status: DC | PRN

## 2022-06-25 MED ORDER — ACETAMINOPHEN 325 MG PO TABS: 650.0000 mg | ORAL_TABLET | ORAL | Status: DC | PRN

## 2022-06-25 MED ORDER — FENTANYL CITRATE (PF) 100 MCG/2ML IJ SOLN
50.0000 ug | INTRAMUSCULAR | Status: DC | PRN
  Administered 2022-06-26 (×6): 100 ug via INTRAVENOUS
  Filled 2022-06-25 (×6): qty 2

## 2022-06-25 NOTE — MAU Note (Signed)
Jo Sloan is a 21 y.o. at [redacted]w[redacted]d here in MAU reporting: SOB that started last night around 1900. Pt states she feels like she has to take deeper and more frequent breaths and she is out of breath even when just standing. Pt states her chest feels heavy as well. SOB and chest heaviness started yesterday but got worse today. Pt states she is also having lower abdominal pain that has been going on for the last week and a half. Pt states it is a throbbing pain. No VB or LOF. +FM.   Onset of complaint: 06/24/2022 Pain score: 4/10 chest pain  6/10 lower abdominal  Vitals:   06/25/22 2038 06/25/22 2039  BP: (!) 145/78 139/77  Pulse: 74 74  Resp: 15   Temp: 98.9 F (37.2 C)   SpO2: 99%      FHT:133 Lab orders placed from triage:

## 2022-06-25 NOTE — H&P (Signed)
OBSTETRIC ADMISSION HISTORY AND PHYSICAL  Jo Sloan is a 21 y.o. female G1P0 with IUP at [redacted]w[redacted]d presenting for IOL. She reports +FMs. No LOF, VB, blurry vision, headaches, peripheral edema, or RUQ pain. Reports SOB and chest pressure with activity and rest since yesterday. Denies other respiratory sx. She plans on breast and formula feeding. She requests POPs for birth control.  Dating: By LMP --->  Estimated Date of Delivery: 06/22/22  Sono:    @[redacted]w[redacted]d , normal anatomy, ceph presentation, 2781g, 24%ile, EFW 6'2   Prenatal History/Complications: -late prenatal care -marginal cord insertion -post dates -elevated BP  Past Medical History: Past Medical History:  Diagnosis Date   Asthma    GERD (gastroesophageal reflux disease)     Past Surgical History: Past Surgical History:  Procedure Laterality Date   NO PAST SURGERIES      Obstetrical History: OB History     Gravida  1   Para      Term      Preterm      AB      Living         SAB      IAB      Ectopic      Multiple      Live Births              Social History: Social History   Socioeconomic History   Marital status: Significant Other    Spouse name: Not on file   Number of children: Not on file   Years of education: Not on file   Highest education level: Not on file  Occupational History   Not on file  Tobacco Use   Smoking status: Never   Smokeless tobacco: Never  Vaping Use   Vaping Use: Never used  Substance and Sexual Activity   Alcohol use: Never   Drug use: Not Currently    Frequency: 2.0 times per week    Types: Marijuana    Comment: stopped in March   Sexual activity: Yes  Other Topics Concern   Not on file  Social History Narrative   Not on file   Social Determinants of Health   Financial Resource Strain: Not on file  Food Insecurity: Not on file  Transportation Needs: Not on file  Physical Activity: Not on file  Stress: Not on file  Social Connections: Not  on file    Family History: Family History  Problem Relation Age of Onset   Healthy Mother    Healthy Father    Diabetes Maternal Grandfather    Hypertension Paternal Grandmother    Asthma Neg Hx    Birth defects Neg Hx    Cancer Neg Hx    Heart disease Neg Hx    Stroke Neg Hx     Allergies: No Known Allergies  Medications Prior to Admission  Medication Sig Dispense Refill Last Dose   metroNIDAZOLE (FLAGYL) 500 MG tablet Take 1 tablet (500 mg total) by mouth 2 (two) times daily. 14 tablet 0    Prenatal Vit-Fe Fumarate-FA (MULTIVITAMIN-PRENATAL) 27-0.8 MG TABS tablet Take 1 tablet by mouth daily at 12 noon.        Review of Systems:  All systems reviewed and negative except as stated in HPI  PE: Blood pressure 139/77, pulse 74, temperature 98.9 F (37.2 C), temperature source Oral, resp. rate 15, height 5\' 1"  (1.549 m), weight 88.3 kg, last menstrual period 09/15/2021, SpO2 97 %. General appearance: alert, cooperative, and no distress Lungs:  regular rate and effort, CTAB Heart: RRR Abdomen: soft, non-tender Extremities: Homans sign is negative, no sign of DVT Presentation: cephalic EFM: 125 bpm, mod variability, + accels, no decels Toco: rare SVE: 1/60/-2, vtx  Prenatal labs: ABO, Rh: O/Positive/-- (06/09 6295) Antibody: Negative (06/09 0823) Rubella: 1.87 (06/09 0823) RPR: Non Reactive (06/09 0823)  HBsAg: Negative (06/09 0823)  HIV: Non Reactive (06/09 0823)  GBS: Negative/-- (08/16 1058)  2 hr GTT nml  Prenatal Transfer Tool  Maternal Diabetes: No Genetic Screening: Normal Maternal Ultrasounds/Referrals: Normal and Other:marginal CI Fetal Ultrasounds or other Referrals:  None Maternal Substance Abuse:  No Significant Maternal Medications:  None Significant Maternal Lab Results: Group B Strep negative  No results found for this or any previous visit (from the past 24 hour(s)).  Patient Active Problem List   Diagnosis Date Noted   Supervision of  normal first pregnancy 03/17/2022   Marginal insertion of umbilical cord affecting management of mother 03/17/2022   Limited prenatal care in third trimester 03/17/2022    Assessment: Jo Sloan is a 21 y.o. G1P0 at [redacted]w[redacted]d here for IOL for post dates and new elevated BP  1. Labor: latent 2. FWB: Cat I 3. Pain: analgesia/anesthesia prn 4. GBS: neg   Plan: Admit to LD PEC labs Cervical ripening Anticipate SVD   Donette Larry, CNM  06/25/2022, 8:53 PM

## 2022-06-25 NOTE — Progress Notes (Signed)
Labor Progress Note Jo Sloan is a 21 y.o. G1P0 at [redacted]w[redacted]d presented for IOL for post dates, elevated BP  S:  Comfortable, no complaints.   O:  BP 139/77   Pulse 82   Temp 97.9 F (36.6 C) (Oral)   Resp 16   Ht 5\' 1"  (1.549 m)   Wt 88.3 kg   LMP 09/15/2021 (Approximate)   SpO2 97%   BMI 36.77 kg/m  EFM: baseline 125 bpm/ mod variability/ + accels/ no decels  Toco/IUPC: rare SVE: Dilation: 1 Presentation: Vertex Exam by:: [name], CNM   A/P: 21 y.o. G1P0 [redacted]w[redacted]d  1. Labor: latetn 2. FWB: Cat I 3. Pain: analgesia/anesthesia/NO prn 4. Elevated BP  PEC labs pending. Consent for FB placement, attempted but unsuccessful. Will start Cytotec for ripening. Anticipate SVD.  [name], CNM 10:45 PM

## 2022-06-26 ENCOUNTER — Encounter (HOSPITAL_COMMUNITY): Payer: Self-pay | Admitting: Obstetrics and Gynecology

## 2022-06-26 ENCOUNTER — Inpatient Hospital Stay (HOSPITAL_COMMUNITY): Payer: Medicaid Other | Admitting: Anesthesiology

## 2022-06-26 DIAGNOSIS — O480 Post-term pregnancy: Secondary | ICD-10-CM | POA: Diagnosis not present

## 2022-06-26 DIAGNOSIS — O9952 Diseases of the respiratory system complicating childbirth: Secondary | ICD-10-CM | POA: Diagnosis not present

## 2022-06-26 DIAGNOSIS — J45909 Unspecified asthma, uncomplicated: Secondary | ICD-10-CM | POA: Diagnosis not present

## 2022-06-26 DIAGNOSIS — Z3A40 40 weeks gestation of pregnancy: Secondary | ICD-10-CM | POA: Diagnosis not present

## 2022-06-26 DIAGNOSIS — O4423 Partial placenta previa NOS or without hemorrhage, third trimester: Secondary | ICD-10-CM | POA: Diagnosis not present

## 2022-06-26 LAB — RPR: RPR Ser Ql: NONREACTIVE

## 2022-06-26 MED ORDER — LACTATED RINGERS IV SOLN
500.0000 mL | Freq: Once | INTRAVENOUS | Status: AC
  Administered 2022-06-26: 500 mL via INTRAVENOUS

## 2022-06-26 MED ORDER — TERBUTALINE SULFATE 1 MG/ML IJ SOLN: 0.2500 mg | Freq: Once | INTRAMUSCULAR | Status: DC | PRN

## 2022-06-26 MED ORDER — TETANUS-DIPHTH-ACELL PERTUSSIS 5-2.5-18.5 LF-MCG/0.5 IM SUSY: 0.5000 mL | PREFILLED_SYRINGE | Freq: Once | INTRAMUSCULAR | Status: DC

## 2022-06-26 MED ORDER — COCONUT OIL OIL: 1.0000 | TOPICAL_OIL | Status: DC | PRN

## 2022-06-26 MED ORDER — PHENYLEPHRINE 80 MCG/ML (10ML) SYRINGE FOR IV PUSH (FOR BLOOD PRESSURE SUPPORT): 80.0000 ug | PREFILLED_SYRINGE | INTRAVENOUS | Status: DC | PRN

## 2022-06-26 MED ORDER — IBUPROFEN 600 MG PO TABS
600.0000 mg | ORAL_TABLET | Freq: Four times a day (QID) | ORAL | Status: DC
  Administered 2022-06-26 – 2022-06-28 (×7): 600 mg via ORAL
  Filled 2022-06-26 (×7): qty 1

## 2022-06-26 MED ORDER — MISOPROSTOL 50MCG HALF TABLET
50.0000 ug | ORAL_TABLET | ORAL | Status: DC | PRN
  Filled 2022-06-26 (×2): qty 1

## 2022-06-26 MED ORDER — PRENATAL MULTIVITAMIN CH
1.0000 | ORAL_TABLET | Freq: Every day | ORAL | Status: DC
  Administered 2022-06-27 – 2022-06-28 (×2): 1 via ORAL
  Filled 2022-06-26 (×2): qty 1

## 2022-06-26 MED ORDER — FENTANYL-BUPIVACAINE-NACL 0.5-0.125-0.9 MG/250ML-% EP SOLN
12.0000 mL/h | EPIDURAL | Status: DC | PRN
  Administered 2022-06-26: 12 mL/h via EPIDURAL
  Filled 2022-06-26: qty 250

## 2022-06-26 MED ORDER — BENZOCAINE-MENTHOL 20-0.5 % EX AERO
1.0000 | INHALATION_SPRAY | CUTANEOUS | Status: DC | PRN
  Administered 2022-06-27: 1 via TOPICAL
  Filled 2022-06-26: qty 56

## 2022-06-26 MED ORDER — ONDANSETRON HCL 4 MG PO TABS: 4.0000 mg | ORAL_TABLET | ORAL | Status: DC | PRN

## 2022-06-26 MED ORDER — DIPHENHYDRAMINE HCL 25 MG PO CAPS: 25.0000 mg | ORAL_CAPSULE | Freq: Four times a day (QID) | ORAL | Status: DC | PRN

## 2022-06-26 MED ORDER — ONDANSETRON HCL 4 MG/2ML IJ SOLN: 4.0000 mg | INTRAMUSCULAR | Status: DC | PRN

## 2022-06-26 MED ORDER — WITCH HAZEL-GLYCERIN EX PADS: 1.0000 | MEDICATED_PAD | CUTANEOUS | Status: DC | PRN

## 2022-06-26 MED ORDER — EPHEDRINE 5 MG/ML INJ: 10.0000 mg | INTRAVENOUS | Status: DC | PRN

## 2022-06-26 MED ORDER — SIMETHICONE 80 MG PO CHEW: 80.0000 mg | CHEWABLE_TABLET | ORAL | Status: DC | PRN

## 2022-06-26 MED ORDER — ACETAMINOPHEN 325 MG PO TABS: 650.0000 mg | ORAL_TABLET | ORAL | Status: DC | PRN

## 2022-06-26 MED ORDER — DIBUCAINE (PERIANAL) 1 % EX OINT: 1.0000 | TOPICAL_OINTMENT | CUTANEOUS | Status: DC | PRN

## 2022-06-26 MED ORDER — OXYTOCIN-SODIUM CHLORIDE 30-0.9 UT/500ML-% IV SOLN
1.0000 m[IU]/min | INTRAVENOUS | Status: DC
  Administered 2022-06-26: 2 m[IU]/min via INTRAVENOUS
  Filled 2022-06-26: qty 500

## 2022-06-26 MED ORDER — DIPHENHYDRAMINE HCL 50 MG/ML IJ SOLN: 12.5000 mg | INTRAMUSCULAR | Status: DC | PRN

## 2022-06-26 MED ORDER — PRENATAL 27-0.8 MG PO TABS: 1.0000 | ORAL_TABLET | Freq: Every day | ORAL | Status: DC

## 2022-06-26 MED ORDER — LIDOCAINE HCL (PF) 1 % IJ SOLN
INTRAMUSCULAR | Status: DC | PRN
  Administered 2022-06-26 (×2): 5 mL via EPIDURAL

## 2022-06-26 MED ORDER — SENNOSIDES-DOCUSATE SODIUM 8.6-50 MG PO TABS
2.0000 | ORAL_TABLET | ORAL | Status: DC
  Administered 2022-06-27 – 2022-06-28 (×2): 2 via ORAL
  Filled 2022-06-26 (×2): qty 2

## 2022-06-26 NOTE — Progress Notes (Addendum)
Labor Progress Note Jo Sloan is a 21 y.o. G1P0 at [redacted]w[redacted]d admitted for induction of labor due to Post dates. Due date 06/22/2022.  Subjective: Patient is doing well. AROM completed at 1223.  Objective: BP 115/71 (BP Location: Left Arm)   Pulse (!) 59   Temp 97.8 F (36.6 C) (Oral)   Resp 18   Ht 5\' 1"  (1.549 m)   Wt 88.3 kg   LMP 09/15/2021 (Approximate)   SpO2 100%   BMI 36.77 kg/m  Total I/O In: -  Out: 1050 [Urine:1050]  FHT:  FHR: 130 bpm, variability: moderate,  accelerations:  Present,  decelerations:  Absent SVE:   Dilation: 6 Effacement (%): 80 Station: Plus 1 Exam by:: Mercato-Ortiz, MD  Labs: Lab Results  Component Value Date   WBC 7.6 06/25/2022   HGB 11.1 (L) 06/25/2022   HCT 33.3 (L) 06/25/2022   MCV 89.3 06/25/2022   PLT 254 06/25/2022    Assessment / Plan: Induction of labor due to postterm,  progressing well on pitocin  Labor: Progressing well, will continue on pitocin. AROM, clear at 1223. SVE in 2-3hr.  Fetal Wellbeing:  Category I Pain Control:  Epidural I/D:   GBS negative Anticipated MOD:  NSVD  Glorious Peach, Medical Student 06/26/2022, 12:29 PM ____ Hyman Bower of Supervision of Student:  I confirm that I have verified the information documented in the medical student's note and that I have also personally reperformed the history, physical exam and all medical decision making activities.  I have verified that all services and findings are accurately documented in this student's note; and I agree with management and plan as outlined in the documentation. I have also made any necessary editorial changes.   Shelda Pal, [place] for [employer], [facility] 06/26/2022 2:07 PM

## 2022-06-26 NOTE — Progress Notes (Signed)
Labor Progress Note Keerstin Bjelland is a 21 y.o. G1P0 at [redacted]w[redacted]d presented for IOL due to post-dates. S: Patient is pushing with RN, at bedside to meet patient as Dr Clement Husbands in another delivery. She notes feeling pressure.  O:  BP 138/87   Pulse 69   Temp 98.1 F (36.7 C) (Axillary)   Resp 18   Ht 5\' 1"  (1.549 m)   Wt 88.3 kg   LMP 09/15/2021 (Approximate)   SpO2 100%   BMI 36.77 kg/m  EFM: 135 baseline/moderate variability/+ accels, + early decelerations and occasional variable decels with pushing  CVE: Dilation: 10 Dilation Complete Date: 06/26/22 Dilation Complete Time: 1723 Effacement (%): 100 Station: Plus 2, Plus 3 Presentation: Vertex Exam by:: Mercado-Ortiz, MD   A&P: 21 y.o. G1P0 [redacted]w[redacted]d presents for IOL for post-dates.  #Labor: Progressing well. Pushing at +2 station, anticipate SVD. #Pain: epidural, well controlled #FWB: overall reassuring with moderate variability and accels, continue pushing #GBS negative   Lenoria Chime, [place] for [place] 5:49 PM

## 2022-06-26 NOTE — Progress Notes (Signed)
Labor Progress Note Jo Sloan is a 21 y.o. G1P0 at [redacted]w[redacted]d presented for IOL for PD  S:  Feeling some painful ctx.  O:  BP 138/83 (BP Location: Left Arm)   Pulse 84   Temp 97.7 F (36.5 C) (Oral)   Resp 18   Ht 5\' 1"  (1.549 m)   Wt 88.3 kg   LMP 09/15/2021 (Approximate)   SpO2 97%   BMI 36.77 kg/m  EFM: baseline 125 bpm/ mod variability/ + accels/ early decels  Toco/IUPC: 2-5 SVE: Dilation: 3 Effacement (%): 60 Station: -1 Presentation: Vertex Exam by:: [name], RN   A/P: 21 y.o. G1P0 [redacted]w[redacted]d  1. Labor: latent 2. FWB:  Cat I 3. Pain: analgesia/anesthesia/NO prn 4. Elevated BP: isolated, now normotensive  S/p FB. Recommend Pitocin next. Anticipate SVD.  [name], CNM 8:05 AM

## 2022-06-26 NOTE — Discharge Summary (Shared)
Postpartum Discharge Summary  Date of Service updated***     Patient Name: Jo Sloan DOB: Nov 24, 2000 MRN: [id number]  Date of admission: 06/25/2022 Delivery date:06/26/2022  Delivering provider: Shelda Pal  Date of discharge: 06/26/2022  Admitting diagnosis: Pregnancy [Z34.90] Intrauterine pregnancy: [redacted]w[redacted]d    Secondary diagnosis:  Principal Problem:   Vaginal delivery Active Problems:   Pregnancy  Additional problems: ***    Discharge diagnosis: Term Pregnancy Delivered                                              Post partum procedures:{Postpartum procedures:23558} Augmentation: AROM, Pitocin, Cytotec, and IP Foley Complications: None  Hospital course: Induction of Labor With Vaginal Delivery   21y.o. yo G1P0 at 490w4das admitted to the hospital 06/25/2022 for induction of labor.  Indication for induction: Postdates.  Patient had an uncomplicated labor course as follows: Membrane Rupture Time/Date: 12:23 PM ,06/26/2022   Delivery Method:Vaginal, Spontaneous  Episiotomy: None  Lacerations:  Vaginal  Details of delivery can be found in separate delivery note.  Patient had a routine postpartum course. Patient is discharged home 06/26/22.  Newborn Data: Birth date:06/26/2022  Birth time:6:58 PM  Gender:Female  Living status:Living  Apgars:9 ,9  Weight:   Magnesium Sulfate received: {Mag received:30440022} BMZ received: No Rhophylac:N/A MMR:N/A T-DaP:{Tdap:23962} Flu: N/A Transfusion:{Transfusion received:30440034}  Physical exam  Vitals:   06/26/22 1630 06/26/22 1700 06/26/22 1745 06/26/22 1930  BP: 134/83 138/87  (!) 131/98  Pulse: 66 69  (!) 104  Resp:      Temp:   99.9 F (37.7 C)   TempSrc:   Axillary   SpO2:      Weight:      Height:       General: {Exam; general:21111117} Lochia: {Desc; appropriate/inappropriate:30686::"appropriate"} Uterine Fundus: {Desc; firm/soft:30687} Incision: {Exam; incision:21111123} DVT Evaluation:  {Exam; dvt:2111122} Labs: Lab Results  Component Value Date   WBC 7.6 06/25/2022   HGB 11.1 (L) 06/25/2022   HCT 33.3 (L) 06/25/2022   MCV 89.3 06/25/2022   PLT 254 06/25/2022      Latest Ref Rng & Units 06/25/2022    9:55 PM  CMP  Glucose 70 - 99 mg/dL 102   BUN 6 - 20 mg/dL 16   Creatinine 0.44 - 1.00 mg/dL 0.74   Sodium 135 - 145 mmol/L 138   Potassium 3.5 - 5.1 mmol/L 3.5   Chloride 98 - 111 mmol/L 107   CO2 22 - 32 mmol/L 21   Calcium 8.9 - 10.3 mg/dL 9.5   Total Protein 6.5 - 8.1 g/dL 6.1   Total Bilirubin 0.3 - 1.2 mg/dL 0.2   Alkaline Phos 38 - 126 U/L 158   AST 15 - 41 U/L 17   ALT 0 - 44 U/L 12    Edinburgh Score:     No data to display           After visit meds:  Allergies as of 06/26/2022   No Known Allergies   Med Rec must be completed prior to using this [facility]        Discharge home in stable condition Infant Feeding: {Baby feeding:23562} Infant Disposition:{CHL IP OB HOME WITH MOYPPJKD:32671}ischarge instruction: per After Visit Summary and Postpartum booklet. Activity: Advance as tolerated. Pelvic rest for 6 weeks.  Diet: {OB diIWPY:09983382}uture Appointments: Future Appointments  Date Time Provider Department  Center  08/04/2022 10:55 AM Nehemiah Settle Tanna Savoy, DO [facility] None   Follow up Visit:  Message sent to [facility] on 06/26/22-- Shelda Pal, DO  Please schedule this patient for a In person postpartum visit in 6 weeks with the following provider: Any provider. Additional Postpartum F/U: n/a   Low risk pregnancy complicated by:  n/a Delivery mode:  Vaginal, Spontaneous  Anticipated Birth Control:  POPs   06/26/2022 Shelda Pal, DO

## 2022-06-26 NOTE — Progress Notes (Signed)
Labor Progress Note Jo Sloan is a 21 y.o. G1P0 at [redacted]w[redacted]d presented for IOL for PD  S: Pt doing well. Epidural in place  O:  BP 131/75   Pulse 63   Temp 97.7 F (36.5 C) (Oral)   Resp 18   Ht 5\' 1"  (1.549 m)   Wt 88.3 kg   LMP 09/15/2021 (Approximate)   SpO2 98%   BMI 36.77 kg/m  EFM: 125 bpm/Moderate variability/ 15x15 accels/ None decels   CVE: Dilation: 5.5 Effacement (%): 90 Station: 0 Presentation: Vertex Exam by:: Arby Barrette, RN   A&P: 21 y.o. G1P0 [redacted]w[redacted]d IOL as above #Labor: Progressing well. SVE in 2-3 hours #Pain: Epidural in place #FWB: CAT 1 #GBS negative  [place], DO 11:31 AM

## 2022-06-26 NOTE — Anesthesia Procedure Notes (Signed)
Epidural Patient location during procedure: OB Start time: 06/26/2022 9:47 AM End time: 06/26/2022 9:01 AM  Staffing Anesthesiologist: Duane Boston, MD Performed: anesthesiologist   Preanesthetic Checklist Completed: patient identified, IV checked, site marked, risks and benefits discussed, monitors and equipment checked, pre-op evaluation and timeout performed  Epidural Patient position: sitting Prep: DuraPrep Patient monitoring: heart rate, cardiac monitor, continuous pulse ox and blood pressure Approach: midline Location: L2-L3 Injection technique: LOR saline  Needle:  Needle type: Tuohy  Needle gauge: 17 G Needle length: 9 cm Needle insertion depth: 6 cm Catheter size: 20 Guage Catheter at skin depth: 11 cm Test dose: negative and Other  Assessment Events: blood not aspirated, injection not painful, no injection resistance and negative IV test  Additional Notes Informed consent obtained prior to proceeding including risk of failure, 1% risk of PDPH, risk of minor discomfort and bruising.  Discussed rare but serious complications including epidural abscess, permanent nerve injury, epidural hematoma.  Discussed alternatives to epidural analgesia and patient desires to proceed.  Timeout performed pre-procedure verifying patient name, procedure, and platelet count.  Patient tolerated procedure well.

## 2022-06-26 NOTE — Progress Notes (Signed)
Labor Progress Note Jo Sloan is a 21 y.o. G1P0 at [redacted]w[redacted]d presented for IOL for post dates  S:  Feeling some ctx, had dose of Fentanyl.  O:  BP 139/77   Pulse 82   Temp 97.9 F (36.6 C) (Oral)   Resp 16   Ht 5\' 1"  (1.549 m)   Wt 88.3 kg   LMP 09/15/2021 (Approximate)   SpO2 97%   BMI 36.77 kg/m  EFM: baseline 135 bpm/ mod variability/ + accels/ no decels  Toco/IUPC: irreg SVE: Dilation: 1 Effacement (%): 60 Station: -2 Presentation: Vertex Exam by:: [name], CNM   A/P: 21 y.o. G1P0 [redacted]w[redacted]d  1. Labor: latent 2. FWB: Cat I 3. Pain: analgesia/anesthesia/NO prn 4. Elevated BP  Consented for FB placement, placed w/o difficulty. Continue Cytotec. Anticipate labor progress and SVD.  [name], CNM 2:55 AM

## 2022-06-26 NOTE — Anesthesia Preprocedure Evaluation (Signed)
Anesthesia Evaluation    Reviewed: Allergy & Precautions, Patient's Chart, lab work & pertinent test results  Airway Mallampati: II  TM Distance: >3 FB Neck ROM: Full    Dental no notable dental hx. (+) Dental Advisory Given   Pulmonary asthma ,    Pulmonary exam normal        Cardiovascular negative cardio ROS Normal cardiovascular exam     Neuro/Psych negative neurological ROS     GI/Hepatic Neg liver ROS, GERD  ,  Endo/Other  negative endocrine ROS  Renal/GU negative Renal ROS     Musculoskeletal negative musculoskeletal ROS (+)   Abdominal   Peds  Hematology negative hematology ROS (+)   Anesthesia Other Findings   Reproductive/Obstetrics                             Anesthesia Physical Anesthesia Plan  ASA: 2  Anesthesia Plan: Epidural   Post-op Pain Management:    Induction:   PONV Risk Score and Plan:   Airway Management Planned:   Additional Equipment:   Intra-op Plan:   Post-operative Plan:   Informed Consent: I have reviewed the patients History and Physical, chart, labs and discussed the procedure including the risks, benefits and alternatives for the proposed anesthesia with the patient or authorized representative who has indicated his/her understanding and acceptance.       Plan Discussed with: Anesthesiologist  Anesthesia Plan Comments:         Anesthesia Quick Evaluation

## 2022-06-27 MED ORDER — ACETAMINOPHEN 325 MG PO TABS
650.0000 mg | ORAL_TABLET | ORAL | Status: DC
  Administered 2022-06-27 – 2022-06-28 (×5): 650 mg via ORAL
  Filled 2022-06-27 (×6): qty 2

## 2022-06-27 NOTE — Progress Notes (Addendum)
Post Partum Day 1   Subjective: no complaints, up ad lib, voiding, tolerating PO, and + flatus  Objective: Blood pressure 129/82, pulse 85, temperature 98 F (36.7 C), temperature source Oral, resp. rate 17, height 5\' 1"  (1.549 m), weight 88.3 kg, last menstrual period 09/15/2021, SpO2 98 %, unknown if currently breastfeeding.  Physical Exam:  General: alert, cooperative, and no distress Lochia: appropriate Uterine Fundus: firm Incision: not applicable DVT Evaluation: No evidence of DVT seen on physical exam. No significant calf/ankle edema. Heart: regular rate and rhythum Lungs: clear to auscultation bilaterally  Recent Labs    06/25/22 2155  HGB 11.1*  HCT 33.3*    Assessment/Plan: Plan for discharge tomorrow, Breastfeeding, Lactation consult, and Contraception Pt would like to discuss oral contraceptive pill. She has never taken them before.  Pt is struggling getting breast feeding established. She would like to speak to the lactation consultant.   LOS: 2 days   Delma Officer, Student-PA 06/27/2022, 7:33 AM  ______ Hyman Bower of Supervision of Student:  I confirm that I have verified the information documented in the physician assistant student's note and that I have also personally reperformed the history, physical exam and all medical decision making activities.  I have verified that all services and findings are accurately documented in this student's note; and I agree with management and plan as outlined in the documentation. I have also made any necessary editorial changes.  Assessment/Plan: Jo Sloan is a 21 y.o. G1P1001 s/p VD at [redacted]w[redacted]d   PPD#1 - Doing well  Routine postpartum care Contraception: POPs Feeding: Breast, lactation consulting Dispo: Plan for discharge tomorrow.  Shelda Pal, [place] for [employer] 06/27/2022 10:49 AM

## 2022-06-27 NOTE — Anesthesia Postprocedure Evaluation (Signed)
Anesthesia Post Note  Patient: Telesha Deguzman  Procedure(s) Performed: AN AD HOC LABOR EPIDURAL     Patient location during evaluation: Mother Baby Anesthesia Type: Epidural Level of consciousness: awake, oriented and awake and alert Pain management: pain level controlled Vital Signs Assessment: post-procedure vital signs reviewed and stable Respiratory status: spontaneous breathing, respiratory function stable and nonlabored ventilation Cardiovascular status: stable Postop Assessment: no headache, adequate PO intake, able to ambulate, patient able to bend at knees and no apparent nausea or vomiting Anesthetic complications: no   No notable events documented.  Last Vitals:  Vitals:   06/27/22 0202 06/27/22 0624  BP: 129/71 129/82  Pulse: 94 85  Resp: 17 17  Temp: 36.8 C 36.7 C  SpO2: 95% 98%    Last Pain:  Vitals:   06/27/22 0624  TempSrc: Oral  PainSc:    Pain Goal: Patients Stated Pain Goal: 0 (06/26/22 0647)                 Nina Mondor

## 2022-06-27 NOTE — Lactation Note (Signed)
This note was copied from a baby's chart. Lactation Consultation Note  Patient Name: Jo Sloan [id number] Date: 06/27/2022 Reason for consult: Initial assessment;Primapara;Term Age:19 hours  Baby fussy. Mom had given 20 ml formula hour ago. Mom trying to latch to breast when LC came into rm. W/RN assistance. Baby wont' suckle at breast. Baby's nose appears congested. LC attempted football position. It took a little bit but baby finally latched but biting mom. Mom in a lot of pain. Chin tug, lips flange but cont. To hurt. LC unlatched nipple pinched.  LC fitted #20 NS. Flanged lips. Mom stated so much better.  Discussed w/mom using DEBP mom agreed. Mom shown how to use DEBP & how to disassemble, clean, & reassemble parts. Mom knows to pump q3h for 15-20 min. #21 flanges given. Newborn feeding habits, behavior, STS, I&O, positioning, support reviewed. Mom encouraged to feed baby 8-12 times/24 hours and with feeding cues.   Reviewed supplementing. Encouraged BF first before formula. Encouraged to call for assistance or questions.  Maternal Data Has patient been taught Hand Expression?: Yes Does the patient have breastfeeding experience prior to this delivery?: No  Feeding Nipple Type: Slow - flow  LATCH Score Latch: Repeated attempts needed to sustain latch, nipple held in mouth throughout feeding, stimulation needed to elicit sucking reflex.  Audible Swallowing: None  Type of Nipple: Everted at rest and after stimulation  Comfort (Breast/Nipple): Soft / non-tender  Hold (Positioning): Full assist, staff holds infant at breast  LATCH Score: 5   Lactation Tools Discussed/Used Tools: Pump;Flanges;Nipple Shields Nipple shield size: 20 Flange Size: 21 Breast pump type: Double-Electric Breast Pump Pump Education: Setup, frequency, and cleaning;Milk Storage Reason for Pumping: NS Pumping frequency: q3hr  Interventions Interventions: Breast feeding basics  reviewed;Adjust position;DEBP;Assisted with latch;Support pillows;Skin to skin;Position options;Breast massage;Hand express;LC Services brochure;Breast compression  Discharge    Consult Status Consult Status: Follow-up Date: 06/28/22 Follow-up type: In-patient    Theodoro Kalata 06/27/2022, 10:50 PM

## 2022-06-28 ENCOUNTER — Other Ambulatory Visit (HOSPITAL_COMMUNITY): Payer: Self-pay

## 2022-06-28 MED ORDER — FUROSEMIDE 20 MG PO TABS
20.0000 mg | ORAL_TABLET | Freq: Every day | ORAL | 0 refills | Status: DC
  Filled 2022-06-28: qty 4, 4d supply, fill #0

## 2022-06-28 MED ORDER — NORETHINDRONE 0.35 MG PO TABS
1.0000 | ORAL_TABLET | Freq: Every day | ORAL | 11 refills | Status: DC
  Filled 2022-06-28: qty 28, 28d supply, fill #0

## 2022-06-28 MED ORDER — FUROSEMIDE 20 MG PO TABS
20.0000 mg | ORAL_TABLET | Freq: Every day | ORAL | Status: DC
  Administered 2022-06-28: 20 mg via ORAL
  Filled 2022-06-28: qty 1

## 2022-06-28 NOTE — Lactation Note (Signed)
This note was copied from a baby's chart. Lactation Consultation Note  Patient Name: Jo Sloan [id number] Date: 06/28/2022 Reason for consult: Follow-up assessment Age:19 hours  P1, Mother 14 years old. Observed latch with good depth.  Demonstrated how to flange lips.  Encouraged mother to offer breast before formula.  Provided mother with comfort gels for sore nipples.  Reviewed engorgement care and monitoring voids/stools.   Maternal Data Has patient been taught Hand Expression?: Yes Does the patient have breastfeeding experience prior to this delivery?: No  Feeding Mother's Current Feeding Choice: Breast Milk and Formula Nipple Type: Slow - flow  Interventions Interventions: Education;Assisted with latch;Comfort gels  Discharge Discharge Education: Engorgement and breast care;Warning signs for feeding baby [place] Program: Yes  Consult Status Consult Status: Complete Date: 06/28/22    Vivianne Master [facility] 06/28/2022, 12:20 PM

## 2022-06-28 NOTE — Lactation Note (Signed)
This note was copied from a baby's chart. Lactation Consultation Note  Patient Name: Jo Sloan [id number] Date: 06/28/2022   Age:19 hours  I was asked by RN to see birthing parent b/c it is "pinching" when infant is at breast. I entered room and birthing parent appeared to be resting. I introduced myself to offer assistance. The birthing parent said she didn't know how to put on the nipple shield. I offered to show her how to do so now or someone could come back later to show her when she was done resting. She chose the latter.    Matthias Hughs [facility] 06/28/2022, 10:53 AM

## 2022-07-05 ENCOUNTER — Telehealth (HOSPITAL_COMMUNITY): Payer: Self-pay | Admitting: *Deleted

## 2022-07-05 NOTE — Telephone Encounter (Signed)
Mom reports feeling good. No concerns about herself at this time. EPDS=4 [facility] score=5) Mom reports baby is doing well. Feeding, peeing, and pooping without difficulty. Safe sleep reviewed. Mom reports no concerns about baby at present.  Odis Hollingshead, RN 07-05-2022 at 10:07am

## 2022-08-04 ENCOUNTER — Ambulatory Visit: Payer: Medicaid Other | Admitting: Family Medicine

## 2022-09-13 ENCOUNTER — Telehealth (INDEPENDENT_AMBULATORY_CARE_PROVIDER_SITE_OTHER): Payer: Medicaid Other | Admitting: Family Medicine

## 2022-09-13 MED ORDER — NORETHINDRONE 0.35 MG PO TABS: 1.0000 | ORAL_TABLET | Freq: Every day | ORAL | 3 refills | Status: DC

## 2022-09-13 NOTE — Progress Notes (Signed)
Post Partum Visit Note  Provider location: Center for [facility] at [facility]   Patient location: Home  I connected with Jo Sloan on 09/13/22 at  2:30 PM EST by MyChart Video Encounter and verified that I am speaking with the correct person using two identifiers. I discussed the limitations, risks, security and privacy concerns of performing an evaluation and management service virtually and the availability of in person appointments. I also discussed with the patient that there may be a patient responsible charge related to this service. The patient expressed understanding and agreed to proceed.  Jo Sloan is a 21 y.o. G21P1001 female who presents for a postpartum visit. She is 8 weeks postpartum following a normal spontaneous vaginal delivery.  I have fully reviewed the prenatal and intrapartum course. The delivery was at 39 gestational weeks.  Anesthesia: epidural. Postpartum course has been uneventful. Baby is doing well. Baby is feeding by bottle - Similac Sensitive RS. Bleeding moderate lochia. Bowel function is normal. Bladder function is normal. Patient is sexually active. Contraception method is oral progesterone-only contraceptive. Postpartum depression screening: negative.   Upstream - 09/13/22 1418       Pregnancy Intention Screening   Does the patient want to become pregnant in the next year? No    Does the patient's partner want to become pregnant in the next year? No    Would the patient like to discuss contraceptive options today? Yes      Contraception Wrap Up   Current Method Oral Contraceptive    End Method Oral Contraceptive    Contraception Counseling Provided Yes    How was the end contraceptive method provided? Provided on site            The pregnancy intention screening data noted above was reviewed. Potential methods of contraception were discussed. The patient elected to proceed with Oral Contraceptive.   Edinburgh  Postnatal Depression Scale - 09/13/22 1416       Edinburgh Postnatal Depression Scale:  In the Past 7 Days   I have been able to laugh and see the funny side of things. 0    I have looked forward with enjoyment to things. 0    I have blamed myself unnecessarily when things went wrong. 1    I have been anxious or worried for no good reason. 0    I have felt scared or panicky for no good reason. 0    Things have been getting on top of me. 0    I have been so unhappy that I have had difficulty sleeping. 0    I have felt sad or miserable. 0    I have been so unhappy that I have been crying. 0    The thought of harming myself has occurred to me. 0    Edinburgh Postnatal Depression Scale Total 1             Health Maintenance Due  Topic Date Due   COVID-19 Vaccine (1) Never done   HPV VACCINES (1 - 2-dose series) Never done    The following portions of the patient's history were reviewed and updated as appropriate: allergies, current medications, past family history, past medical history, past social history, past surgical history, and problem list.  Review of Systems Pertinent items are noted in HPI.  Objective:  There were no vitals taken for this visit.   General:  alert, cooperative, and no distress   Breasts:  not indicated  Lungs: Nonlabored  breathing       Assessment:   1. Postpartum exam   Plan:   Essential components of care per ACOG recommendations:  1.  Mood and well being: Patient with negative depression screening today. Reviewed local resources for support.  - Patient tobacco use? No.   - hx of drug use? No.    2. Infant care and feeding:  -Patient currently breastmilk feeding? No.  -Social determinants of health (SDOH) reviewed in EPIC. No concerns  3. Sexuality, contraception and birth spacing - Patient does not want a pregnancy in the next year.   - Reviewed reproductive life planning. Reviewed contraceptive methods based on pt preferences and  effectiveness.  Patient desired Oral Contraceptive today.   - Discussed birth spacing of 18 months  4. Sleep and fatigue -Encouraged family/partner/community support of 4 hrs of uninterrupted sleep to help with mood and fatigue  5. Physical Recovery  - Discussed patients delivery and complications. She describes her labor as good. - Patient had a Vaginal, no problems at delivery. Patient had a  vaginal wall  laceration. Perineal healing reviewed. Patient expressed understanding - Patient has urinary incontinence? No. - Patient is safe to resume physical and sexual activity  6.  Health Maintenance - HM due items addressed Yes - Last pap smear No results found for: "DIAGPAP" Pap smear not done at today's visit.  -Breast Cancer screening indicated? No.   7. Chronic Disease/Pregnancy Condition follow up: None  - PCP follow up  Jo Heritage, DO Center for [facility], [facility]

## 2022-12-12 ENCOUNTER — Other Ambulatory Visit: Payer: Self-pay

## 2022-12-12 DIAGNOSIS — Z3040 Encounter for surveillance of contraceptives, unspecified: Secondary | ICD-10-CM

## 2022-12-12 MED ORDER — NORETHINDRONE 0.35 MG PO TABS: 1.0000 | ORAL_TABLET | Freq: Every day | ORAL | 3 refills | Status: DC

## 2023-01-16 ENCOUNTER — Ambulatory Visit: Payer: Self-pay | Admitting: Nurse Practitioner

## 2023-02-19 ENCOUNTER — Ambulatory Visit: Payer: Medicaid Other | Admitting: Nurse Practitioner

## 2023-02-19 ENCOUNTER — Ambulatory Visit (INDEPENDENT_AMBULATORY_CARE_PROVIDER_SITE_OTHER): Payer: Medicaid Other | Admitting: Nurse Practitioner

## 2023-02-19 ENCOUNTER — Encounter: Payer: Self-pay | Admitting: Nurse Practitioner

## 2023-02-19 VITALS — BP 120/74 | HR 57 | Ht 61.25 in | Wt 188.0 lb

## 2023-02-19 DIAGNOSIS — J4521 Mild intermittent asthma with (acute) exacerbation: Secondary | ICD-10-CM

## 2023-02-19 DIAGNOSIS — J0140 Acute pansinusitis, unspecified: Secondary | ICD-10-CM | POA: Diagnosis not present

## 2023-02-19 DIAGNOSIS — B351 Tinea unguium: Secondary | ICD-10-CM | POA: Insufficient documentation

## 2023-02-19 MED ORDER — AMOXICILLIN-POT CLAVULANATE 875-125 MG PO TABS: 1.0000 | ORAL_TABLET | Freq: Two times a day (BID) | ORAL | 0 refills | Status: AC

## 2023-02-19 MED ORDER — ALBUTEROL SULFATE HFA 108 (90 BASE) MCG/ACT IN AERS: 1.0000 | INHALATION_SPRAY | Freq: Four times a day (QID) | RESPIRATORY_TRACT | 3 refills | Status: AC | PRN

## 2023-02-19 MED ORDER — CICLOPIROX 8 % EX SOLN: Freq: Every day | CUTANEOUS | 3 refills | Status: AC

## 2023-02-19 MED ORDER — TERBINAFINE HCL 250 MG PO TABS: 250.0000 mg | ORAL_TABLET | Freq: Every day | ORAL | 1 refills | Status: AC

## 2023-02-19 NOTE — Patient Instructions (Addendum)
I have sent in an antibiotic for your sinus infection. If this does not seem to be getting better in the next 4-5 days please let me know.   I have sent in the paint for your toenail and the pill for you to take to help with that infection. It can take 3-6 months to completely get better, but hopefully you will see improvement in the next month or so.

## 2023-02-19 NOTE — Progress Notes (Signed)
Tollie Eth, DNP, AGNP-c [facility] [address] [place], [place] [address] Main Office [phone]   New patient visit   Patient: Jo Sloan   DOB: April 10, 2001   22 y.o. Female  MRN: [id number] Visit Date: 02/19/2023  Patient Care Team: Sativa Gelles, Sung Amabile, NP as PCP - General (Nurse Practitioner) Rolm Bookbinder, CNM as PCP - OBGYN (Certified Nurse Midwife)  Today's Vitals   02/19/23 1339  BP: 120/74  Pulse: (!) 57  Weight: 188 lb (85.3 kg)  Height: 5' 1.25" (1.556 m)   Body mass index is 35.23 kg/m.   Today's healthcare provider: Tollie Eth, NP   Chief Complaint  Patient presents with   other    New pt. Est. ST, congestion, runny nose, for three days, big toe on rt. Foot maybe infected, started about 3 months ago,    Subjective    Jo Sloan is a 22 y.o. female who presents today as a new patient to establish care.    Patient endorses the following concerns presently: Right great toe Jo Sloan presents today with chief complaints of sore throat, congestion, and runny nose that began three days ago. She reports significant soreness in her throat upon waking, raising concerns about her symptoms.  Additionally, Jo Sloan discusses a longstanding issue with her right big toe that has been problematic for about a year but only recently became bothersome. She suspects a fungal infection due to discoloration of the toe and has attempted treatment with over-the-counter products without success. Jo Sloan describes severe pain when pressure is applied or when the toe is touched.   History reviewed and reveals the following: Past Medical History:  Diagnosis Date   Asthma    GERD (gastroesophageal reflux disease)    Limited prenatal care in third trimester 03/17/2022   Onset of care at 27 weeks   Marginal insertion of umbilical cord affecting management of mother 03/17/2022   Pregnancy 06/25/2022   Supervision of normal first  pregnancy 03/17/2022          Nursing Staff  Provider  Office Location   [place]  Dating      [facility] Model  [ ]  Traditional  [ ]  Centering  [ ]  Mom-Baby Dyad        Language      Anatomy US      Flu Vaccine      Genetic/Carrier Screen   NIPS:     AFP:     Horizon:  TDaP Vaccine      Hgb A1C or   GTT  Marlayna Bannister   Third trimester nl 2 hour  COVID Vaccine        LAB RESULTS   Rhogam      Blood Type  O/Positive/-- (06/09 2130)    Vaginal delivery 06/26/2022   Past Surgical History:  Procedure Laterality Date   NO PAST SURGERIES     Family Status  Relation Name Status   Mother  Alive   Father  Alive   MGF  (Not Specified)   PGM  (Not Specified)   Neg Hx  (Not Specified)   Family History  Problem Relation Age of Onset   Healthy Mother    Healthy Father    Diabetes Maternal Grandfather    Hypertension Paternal Grandmother    Asthma Neg Hx    Birth defects Neg Hx    Cancer Neg Hx    Heart disease Neg Hx    Stroke Neg Hx  Social History   Socioeconomic History   Marital status: Significant Other    Spouse name: Not on file   Number of children: 1   Years of education: Not on file   Highest education level: Not on file  Occupational History   Not on file  Tobacco Use   Smoking status: Never    Passive exposure: Never   Smokeless tobacco: Never  Vaping Use   Vaping Use: Never used  Substance and Sexual Activity   Alcohol use: Never   Drug use: Not Currently    Frequency: 2.0 times per week    Types: Marijuana    Comment: stopped in March   Sexual activity: Yes    Partners: Male    Birth control/protection: Pill  Other Topics Concern   Not on file  Social History Narrative   69 month old daughter, [name].    Social Determinants of Health   Financial Resource Strain: Not on file  Food Insecurity: Not on file  Transportation Needs: Not on file  Physical Activity: Not on file  Stress: Not on file  Social Connections: Not on file   Outpatient Medications Prior to Visit   Medication Sig   norethindrone (ORTHO MICRONOR) 0.35 MG tablet Take 1 tablet (0.35 mg total) by mouth daily.   [DISCONTINUED] furosemide (LASIX) 20 MG tablet Take 1 tablet (20 mg total) by mouth daily for 4 days.   [DISCONTINUED] Prenatal Vit-Fe Fumarate-FA (MULTIVITAMIN-PRENATAL) 27-0.8 MG TABS tablet Take 1 tablet by mouth daily at 12 noon. (Patient not taking: Reported on 09/13/2022)   No facility-administered medications prior to visit.   No Known Allergies Immunization History  Administered Date(s) Administered   Influenza,inj,Quad PF,6+ Mos 06/16/2022   Tdap 03/17/2022    Health Maintenance Due The patient has no Health Maintenance topics of status Overdue, Due On, or Due Soon   Review of Systems All review of systems negative except what is listed in the HPI   Objective    BP 120/74   Pulse (!) 57   Ht 5' 1.25" (1.556 m)   Wt 188 lb (85.3 kg)   LMP 02/11/2023   BMI 35.23 kg/m  Physical Exam Vitals and nursing note reviewed.  Constitutional:      Appearance: Normal appearance.  HENT:     Head: Normocephalic.     Right Ear: Tympanic membrane normal.     Left Ear: A middle ear effusion is present.     Nose: Mucosal edema and rhinorrhea present.     Right Sinus: Maxillary sinus tenderness and frontal sinus tenderness present.     Left Sinus: Maxillary sinus tenderness and frontal sinus tenderness present.     Mouth/Throat:     Mouth: Mucous membranes are moist.     Pharynx: Posterior oropharyngeal erythema present.  Eyes:     General: No scleral icterus.    Conjunctiva/sclera: Conjunctivae normal.  Cardiovascular:     Rate and Rhythm: Normal rate and regular rhythm.     Pulses: Normal pulses.     Heart sounds: Normal heart sounds.  Pulmonary:     Effort: Pulmonary effort is normal.     Breath sounds: Wheezing present.  Musculoskeletal:       Feet:  Lymphadenopathy:     Cervical: Cervical adenopathy present.  Skin:    General: Skin is warm and dry.      Capillary Refill: Capillary refill takes less than 2 seconds.  Neurological:     General: No focal deficit present.  Mental Status: She is alert.  Psychiatric:        Mood and Affect: Mood normal.     No results found for any visits on 02/19/23.  Assessment & Plan      Problem List Items Addressed This Visit     Onychomycosis of great toe - Primary    Glennisha presents with a chronic fungal infection of the right big toenail, which has been symptomatic for approximately one year but has recently become bothersome. The toe exhibits discoloration, and pain is experienced upon pressure application or nail impact.  Plan: - Initiate treatment with a topical antifungal (nail lacquer) to be applied daily for seven days, followed by cleaning with an alcohol wipe and repeating the cycle. - Prescribe an oral antifungal to address the infection systemically. - Advise Maze on the prolonged nature of treatment and the expected gradual improvement as the nail grows out.      Relevant Medications   ciclopirox (PENLAC) 8 % solution   terbinafine (LAMISIL) 250 MG tablet   Acute non-recurrent pansinusitis    Shenique reports symptoms consistent with a sinus infection, including sore throat, congestion, and runny nose, which began three days ago. Examination findings suggest sinus congestion and possible fluid behind the eardrums, likely secondary to the sinus infection.   Plan: - Prescribe an antibiotic to manage the bacterial component of the sinus infection. OK to wait 2 days to see if symptoms improve before starting.  - Albuterol sent for wheezing - Instruct Sherlyne to monitor her symptoms and contact the clinic if there is no improvement within 4-5 days or if she experiences any adverse effects such as a yeast infection. - Provide instructions to reach out for additional treatment if unusual vaginal discharge or itching occurs.      Relevant Medications   terbinafine (LAMISIL) 250 MG  tablet   amoxicillin-clavulanate (AUGMENTIN) 875-125 MG tablet   Other Visit Diagnoses     Mild intermittent asthma with acute exacerbation       Relevant Medications   albuterol (VENTOLIN HFA) 108 (90 Base) MCG/ACT inhaler        Return in about 6 months (around 08/22/2023) for CPE.      Sherree Shankman, Sung Amabile, NP, DNP, AGNP-C [facility]

## 2023-02-22 ENCOUNTER — Encounter: Payer: Self-pay | Admitting: Nurse Practitioner

## 2023-02-22 DIAGNOSIS — B3731 Acute candidiasis of vulva and vagina: Secondary | ICD-10-CM

## 2023-02-22 MED ORDER — FLUCONAZOLE 150 MG PO TABS: 150.0000 mg | ORAL_TABLET | Freq: Once | ORAL | 2 refills | Status: AC

## 2023-03-09 NOTE — Assessment & Plan Note (Signed)
Jo Sloan presents with a chronic fungal infection of the right big toenail, which has been symptomatic for approximately one year but has recently become bothersome. The toe exhibits discoloration, and pain is experienced upon pressure application or nail impact.  Plan: - Initiate treatment with a topical antifungal (nail lacquer) to be applied daily for seven days, followed by cleaning with an alcohol wipe and repeating the cycle. - Prescribe an oral antifungal to address the infection systemically. - Advise [name] on the prolonged nature of treatment and the expected gradual improvement as the nail grows out.

## 2023-03-09 NOTE — Assessment & Plan Note (Addendum)
Jo Sloan reports symptoms consistent with a sinus infection, including sore throat, congestion, and runny nose, which began three days ago. Examination findings suggest sinus congestion and possible fluid behind the eardrums, likely secondary to the sinus infection.   Plan: - Prescribe an antibiotic to manage the bacterial component of the sinus infection. OK to wait 2 days to see if symptoms improve before starting.  - Albuterol sent for wheezing - Instruct [name] to monitor her symptoms and contact the clinic if there is no improvement within 4-5 days or if she experiences any adverse effects such as a yeast infection. - Provide instructions to reach out for additional treatment if unusual vaginal discharge or itching occurs.

## 2023-05-03 ENCOUNTER — Other Ambulatory Visit: Payer: Self-pay

## 2023-05-03 DIAGNOSIS — Z3040 Encounter for surveillance of contraceptives, unspecified: Secondary | ICD-10-CM

## 2023-05-03 MED ORDER — NORETHINDRONE 0.35 MG PO TABS: 1.0000 | ORAL_TABLET | Freq: Every day | ORAL | 3 refills | Status: DC

## 2023-05-03 NOTE — Progress Notes (Signed)
Patient called requesting a Rx for birth control. Micronor 0.35 take 1 tablet by mouth daily was sent to her pharmacy. Understanding was voiced. Alson Mcpheeters l Rillie Riffel, CMA

## 2023-05-23 ENCOUNTER — Ambulatory Visit: Payer: Medicaid Other | Admitting: Nurse Practitioner

## 2023-06-12 ENCOUNTER — Ambulatory Visit (INDEPENDENT_AMBULATORY_CARE_PROVIDER_SITE_OTHER): Payer: Medicaid Other

## 2023-06-12 VITALS — BP 134/82 | HR 72

## 2023-06-12 DIAGNOSIS — N939 Abnormal uterine and vaginal bleeding, unspecified: Secondary | ICD-10-CM | POA: Diagnosis not present

## 2023-06-12 LAB — POCT URINE PREGNANCY: Preg Test, Ur: NEGATIVE

## 2023-06-12 NOTE — Progress Notes (Signed)
LMP 05-29-23  Patient states she also saw some spotting on 06-09-23. Patient currently on ortho-micronor.   Patient presents to clinic for pregnancy test. Armandina Stammer RN

## 2023-08-09 ENCOUNTER — Ambulatory Visit: Payer: Medicaid Other | Admitting: Family Medicine

## 2024-04-24 ENCOUNTER — Ambulatory Visit: Payer: Self-pay

## 2024-04-24 NOTE — Telephone Encounter (Signed)
 Scheduled pt for 04/25/24

## 2024-04-24 NOTE — Telephone Encounter (Signed)
 FYI Only or Action Required?: FYI only for provider.  Patient was last seen in primary care on 02/19/2023 by Early, Camie BRAVO, NP.  Called Nurse Triage reporting Insect Bite.  Symptoms began several days ago.  Interventions attempted: Nothing.  Symptoms are: gradually worsening.  Triage Disposition: See PCP When Office is Open (Within 3 Days)  Patient/caregiver understands and will follow disposition?: Yes       Copied from CRM [id number]. Topic: Clinical - Red Word Triage >> Apr 24, 2024  2:18 PM Fredrica W wrote: Red Word that prompted transfer to Nurse Triage: stung by yellow jacket - swollen - bumpy. Reason for Disposition  [1] Red or very tender (to touch) area AND [2] getting larger over 48 hours after the sting  Answer Assessment - Initial Assessment Questions 1. TYPE: What type of sting was it? (e.g., bee, yellow jacket, unknown)      Yellow jacket 2. ONSET: When did it occur?      X 4 days ago 3. LOCATION: Where is the sting located?  How many stings?     L hand middle finger- 1 sting 4. SWELLING SIZE: How big is the swelling? (e.g., inches or cm)     Swelling from middle finger to wrist joint 5. REDNESS: Is the area red or pink? If Yes, ask: What size is area of redness? (e.g., inches or cm). When did the redness start?     Yes, worsening 6. PAIN: Is there any pain? If Yes, ask: How bad is it?  (Scale 0-10; or none, mild, moderate, severe)     Mild pain - feel the sensation of itchiness 7. ITCHING: Is there any itching? If Yes, ask: How bad is it?      Yes, initially - now resolving 8. RESPIRATORY DISTRESS: Describe your breathing.     denies 9. PRIOR REACTIONS: Have you had any severe allergic reactions to stings in the past? If Yes, ask: What happened?     First time being stung  10. OTHER SYMPTOMS: Do you have any other symptoms? (e.g., abdomen pain, face or tongue swelling, new rash elsewhere, vomiting)       denies 11.  PREGNANCY: Is there any chance you are pregnant? When was your last menstrual period?       Denies, LMP currently on    Scheduled patient on the next available acute appt on April 28, 2024 with alternate provider .  Protocols used: Bee or Yellow Jacket Sting-A-AH

## 2024-04-25 ENCOUNTER — Ambulatory Visit: Admitting: Medical

## 2024-04-28 ENCOUNTER — Ambulatory Visit: Admitting: Family Medicine

## 2024-06-13 ENCOUNTER — Other Ambulatory Visit: Payer: Self-pay | Admitting: Family Medicine

## 2024-06-13 DIAGNOSIS — Z3040 Encounter for surveillance of contraceptives, unspecified: Secondary | ICD-10-CM

## 2024-06-20 ENCOUNTER — Other Ambulatory Visit: Payer: Self-pay | Admitting: *Deleted

## 2024-06-20 DIAGNOSIS — Z3040 Encounter for surveillance of contraceptives, unspecified: Secondary | ICD-10-CM

## 2024-06-20 MED ORDER — NORETHINDRONE 0.35 MG PO TABS: 1.0000 | ORAL_TABLET | Freq: Every day | ORAL | 1 refills | Status: AC

## 2024-06-20 NOTE — Progress Notes (Signed)
 Returned call to patient but did not get an answer; left voicemail; she had requested refills for contraception.  2 refills sent in to her Pharmacy on file [facility]).

## 2024-11-13 ENCOUNTER — Emergency Department (HOSPITAL_COMMUNITY): Admission: EM | Admit: 2024-11-13 | Discharge: 2024-11-14 | Source: Home / Self Care

## 2024-11-13 ENCOUNTER — Encounter (HOSPITAL_COMMUNITY): Payer: Self-pay

## 2024-11-13 ENCOUNTER — Other Ambulatory Visit: Payer: Self-pay

## 2024-11-13 LAB — COMPREHENSIVE METABOLIC PANEL WITH GFR
ALT: 28 U/L (ref 0–44)
AST: 20 U/L (ref 15–41)
Albumin: 4.4 g/dL (ref 3.5–5.0)
Alkaline Phosphatase: 104 U/L (ref 38–126)
Anion gap: 10 (ref 5–15)
BUN: 11 mg/dL (ref 6–20)
CO2: 23 mmol/L (ref 22–32)
Calcium: 10.3 mg/dL (ref 8.9–10.3)
Chloride: 104 mmol/L (ref 98–111)
Creatinine, Ser: 0.65 mg/dL (ref 0.44–1.00)
GFR, Estimated: >60 mL/min
Glucose, Bld: 90 mg/dL (ref 70–99)
Potassium: 4.1 mmol/L (ref 3.5–5.1)
Sodium: 137 mmol/L (ref 135–145)
Total Bilirubin: 0.4 mg/dL (ref 0.0–1.2)
Total Protein: 7.7 g/dL (ref 6.5–8.1)

## 2024-11-13 LAB — URINALYSIS, ROUTINE W REFLEX MICROSCOPIC
Bilirubin Urine: NEGATIVE
Glucose, UA: NEGATIVE mg/dL
Hgb urine dipstick: NEGATIVE
Ketones, ur: 5 mg/dL — AB
Leukocytes,Ua: NEGATIVE
Nitrite: NEGATIVE
Protein, ur: NEGATIVE mg/dL
Specific Gravity, Urine: 1.014 (ref 1.005–1.030)
pH: 6 (ref 5.0–8.0)

## 2024-11-13 LAB — CBC
HCT: 35.4 % — ABNORMAL LOW (ref 36.0–46.0)
Hemoglobin: 11.5 g/dL — ABNORMAL LOW (ref 12.0–15.0)
MCH: 29 pg (ref 26.0–34.0)
MCHC: 32.5 g/dL (ref 30.0–36.0)
MCV: 89.4 fL (ref 80.0–100.0)
Platelets: 315 10*3/uL (ref 150–400)
RBC: 3.96 MIL/uL (ref 3.87–5.11)
RDW: 14 % (ref 11.5–15.5)
WBC: 6.7 10*3/uL (ref 4.0–10.5)
nRBC: 0 % (ref 0.0–0.2)

## 2024-11-13 LAB — HCG, SERUM, QUALITATIVE: Preg, Serum: POSITIVE — AB

## 2024-11-13 LAB — LIPASE, BLOOD: Lipase: 21 U/L (ref 11–51)

## 2024-11-13 NOTE — ED Triage Notes (Signed)
 Pt. Arrives for generalized abdominal pain that feels like pressure. Pain started yesterday.  Denies NVD found out that she was pregnant 2 days ago as well.

## 2024-11-14 NOTE — ED Notes (Signed)
 Patient has left.

## 2024-11-14 NOTE — ED Notes (Addendum)
 Pt called 3x to be roomed to the back. Pt not in the lobby. Per registration staff pt left.
